# Patient Record
Sex: Male | Born: 1970 | Race: Black or African American | Hispanic: No | Marital: Single | Smoking: Current every day smoker
Health system: Southern US, Community
[De-identification: ages and names within clinical notes are randomized; demographics above are authoritative.]

## PROBLEM LIST (undated history)

## (undated) DIAGNOSIS — R569 Unspecified convulsions: Secondary | ICD-10-CM

## (undated) DIAGNOSIS — F1011 Alcohol abuse, in remission: Secondary | ICD-10-CM

## (undated) DIAGNOSIS — Z87828 Personal history of other (healed) physical injury and trauma: Secondary | ICD-10-CM

## (undated) DIAGNOSIS — F99 Mental disorder, not otherwise specified: Secondary | ICD-10-CM

## (undated) DIAGNOSIS — M7541 Impingement syndrome of right shoulder: Secondary | ICD-10-CM

## (undated) DIAGNOSIS — I499 Cardiac arrhythmia, unspecified: Secondary | ICD-10-CM

## (undated) HISTORY — PX: CYSTOSCOPY W/ URETERAL STENT PLACEMENT: SHX1429

---

## 1996-09-25 DIAGNOSIS — Z87828 Personal history of other (healed) physical injury and trauma: Secondary | ICD-10-CM

## 1996-09-25 HISTORY — DX: Personal history of other (healed) physical injury and trauma: Z87.828

## 1998-09-08 ENCOUNTER — Emergency Department (HOSPITAL_COMMUNITY): Admission: EM | Admit: 1998-09-08 | Discharge: 1998-09-08 | Payer: Self-pay | Admitting: Family Medicine

## 1999-02-09 ENCOUNTER — Emergency Department (HOSPITAL_COMMUNITY): Admission: EM | Admit: 1999-02-09 | Discharge: 1999-02-09 | Payer: Self-pay | Admitting: Emergency Medicine

## 1999-02-10 ENCOUNTER — Ambulatory Visit (HOSPITAL_COMMUNITY): Admission: RE | Admit: 1999-02-10 | Discharge: 1999-02-10 | Payer: Self-pay | Admitting: *Deleted

## 1999-05-27 ENCOUNTER — Emergency Department (HOSPITAL_COMMUNITY): Admission: EM | Admit: 1999-05-27 | Discharge: 1999-05-27 | Payer: Self-pay | Admitting: Emergency Medicine

## 1999-11-13 ENCOUNTER — Emergency Department (HOSPITAL_COMMUNITY): Admission: EM | Admit: 1999-11-13 | Discharge: 1999-11-13 | Payer: Self-pay | Admitting: Emergency Medicine

## 2000-02-17 ENCOUNTER — Ambulatory Visit (HOSPITAL_COMMUNITY): Admission: RE | Admit: 2000-02-17 | Discharge: 2000-02-17 | Payer: Self-pay | Admitting: *Deleted

## 2000-03-04 ENCOUNTER — Emergency Department (HOSPITAL_COMMUNITY): Admission: EM | Admit: 2000-03-04 | Discharge: 2000-03-04 | Payer: Self-pay | Admitting: Emergency Medicine

## 2000-05-26 ENCOUNTER — Encounter: Payer: Self-pay | Admitting: Emergency Medicine

## 2000-05-26 ENCOUNTER — Emergency Department (HOSPITAL_COMMUNITY): Admission: EM | Admit: 2000-05-26 | Discharge: 2000-05-26 | Payer: Self-pay | Admitting: Emergency Medicine

## 2000-06-04 ENCOUNTER — Emergency Department (HOSPITAL_COMMUNITY): Admission: EM | Admit: 2000-06-04 | Discharge: 2000-06-04 | Payer: Self-pay | Admitting: Emergency Medicine

## 2000-06-04 ENCOUNTER — Encounter: Payer: Self-pay | Admitting: Emergency Medicine

## 2002-09-25 ENCOUNTER — Encounter: Payer: Self-pay | Admitting: Emergency Medicine

## 2002-09-25 ENCOUNTER — Emergency Department (HOSPITAL_COMMUNITY): Admission: EM | Admit: 2002-09-25 | Discharge: 2002-09-25 | Payer: Self-pay

## 2003-07-07 ENCOUNTER — Emergency Department (HOSPITAL_COMMUNITY): Admission: AD | Admit: 2003-07-07 | Discharge: 2003-07-08 | Payer: Self-pay | Admitting: Emergency Medicine

## 2003-07-08 ENCOUNTER — Encounter: Payer: Self-pay | Admitting: Emergency Medicine

## 2003-07-12 ENCOUNTER — Emergency Department (HOSPITAL_COMMUNITY): Admission: EM | Admit: 2003-07-12 | Discharge: 2003-07-12 | Payer: Self-pay | Admitting: Emergency Medicine

## 2003-09-26 DIAGNOSIS — I499 Cardiac arrhythmia, unspecified: Secondary | ICD-10-CM

## 2003-09-26 HISTORY — DX: Cardiac arrhythmia, unspecified: I49.9

## 2003-11-04 ENCOUNTER — Inpatient Hospital Stay (HOSPITAL_COMMUNITY): Admission: AC | Admit: 2003-11-04 | Discharge: 2003-11-24 | Payer: Self-pay

## 2004-01-07 ENCOUNTER — Encounter: Admission: RE | Admit: 2004-01-07 | Discharge: 2004-02-23 | Payer: Self-pay | Admitting: Orthopedic Surgery

## 2004-09-24 ENCOUNTER — Ambulatory Visit: Payer: Self-pay | Admitting: Internal Medicine

## 2004-09-24 ENCOUNTER — Inpatient Hospital Stay (HOSPITAL_COMMUNITY): Admission: EM | Admit: 2004-09-24 | Discharge: 2004-09-27 | Payer: Self-pay | Admitting: Emergency Medicine

## 2004-09-25 HISTORY — PX: SHOULDER ARTHROSCOPY: SHX128

## 2004-09-25 HISTORY — PX: URETEROPLASTY: SUR1409

## 2004-10-07 ENCOUNTER — Ambulatory Visit: Payer: Self-pay | Admitting: Internal Medicine

## 2004-11-22 ENCOUNTER — Ambulatory Visit (HOSPITAL_COMMUNITY): Admission: RE | Admit: 2004-11-22 | Discharge: 2004-11-23 | Payer: Self-pay | Admitting: Orthopedic Surgery

## 2005-05-28 ENCOUNTER — Emergency Department (HOSPITAL_COMMUNITY): Admission: EM | Admit: 2005-05-28 | Discharge: 2005-05-28 | Payer: Self-pay | Admitting: Emergency Medicine

## 2005-06-06 ENCOUNTER — Emergency Department (HOSPITAL_COMMUNITY): Admission: EM | Admit: 2005-06-06 | Discharge: 2005-06-06 | Payer: Self-pay | Admitting: Family Medicine

## 2006-01-07 ENCOUNTER — Emergency Department (HOSPITAL_COMMUNITY): Admission: EM | Admit: 2006-01-07 | Discharge: 2006-01-07 | Payer: Self-pay | Admitting: Emergency Medicine

## 2006-02-24 ENCOUNTER — Emergency Department (HOSPITAL_COMMUNITY): Admission: EM | Admit: 2006-02-24 | Discharge: 2006-02-24 | Payer: Self-pay | Admitting: Emergency Medicine

## 2006-02-24 ENCOUNTER — Emergency Department (HOSPITAL_COMMUNITY): Admission: EM | Admit: 2006-02-24 | Discharge: 2006-02-25 | Payer: Self-pay | Admitting: Emergency Medicine

## 2006-03-02 ENCOUNTER — Emergency Department (HOSPITAL_COMMUNITY): Admission: EM | Admit: 2006-03-02 | Discharge: 2006-03-02 | Payer: Self-pay | Admitting: Emergency Medicine

## 2006-03-26 ENCOUNTER — Emergency Department (HOSPITAL_COMMUNITY): Admission: EM | Admit: 2006-03-26 | Discharge: 2006-03-26 | Payer: Self-pay | Admitting: Emergency Medicine

## 2006-03-26 ENCOUNTER — Ambulatory Visit: Payer: Self-pay | Admitting: Family Medicine

## 2006-04-10 ENCOUNTER — Emergency Department (HOSPITAL_COMMUNITY): Admission: EM | Admit: 2006-04-10 | Discharge: 2006-04-10 | Payer: Self-pay | Admitting: Emergency Medicine

## 2006-04-12 ENCOUNTER — Emergency Department (HOSPITAL_COMMUNITY): Admission: EM | Admit: 2006-04-12 | Discharge: 2006-04-13 | Payer: Self-pay | Admitting: Emergency Medicine

## 2006-04-18 ENCOUNTER — Ambulatory Visit: Payer: Self-pay | Admitting: Family Medicine

## 2006-04-22 ENCOUNTER — Emergency Department (HOSPITAL_COMMUNITY): Admission: EM | Admit: 2006-04-22 | Discharge: 2006-04-22 | Payer: Self-pay | Admitting: Emergency Medicine

## 2006-04-23 ENCOUNTER — Emergency Department (HOSPITAL_COMMUNITY): Admission: EM | Admit: 2006-04-23 | Discharge: 2006-04-24 | Payer: Self-pay | Admitting: Emergency Medicine

## 2006-04-23 ENCOUNTER — Ambulatory Visit: Payer: Self-pay | Admitting: Family Medicine

## 2006-05-02 ENCOUNTER — Emergency Department (HOSPITAL_COMMUNITY): Admission: EM | Admit: 2006-05-02 | Discharge: 2006-05-02 | Payer: Self-pay | Admitting: Emergency Medicine

## 2006-05-13 ENCOUNTER — Emergency Department (HOSPITAL_COMMUNITY): Admission: EM | Admit: 2006-05-13 | Discharge: 2006-05-14 | Payer: Self-pay | Admitting: Emergency Medicine

## 2006-05-16 ENCOUNTER — Emergency Department (HOSPITAL_COMMUNITY): Admission: EM | Admit: 2006-05-16 | Discharge: 2006-05-16 | Payer: Self-pay | Admitting: Emergency Medicine

## 2006-05-23 ENCOUNTER — Ambulatory Visit: Payer: Self-pay | Admitting: Family Medicine

## 2006-09-05 ENCOUNTER — Ambulatory Visit (HOSPITAL_BASED_OUTPATIENT_CLINIC_OR_DEPARTMENT_OTHER): Admission: RE | Admit: 2006-09-05 | Discharge: 2006-09-05 | Payer: Self-pay | Admitting: Urology

## 2006-10-21 ENCOUNTER — Emergency Department (HOSPITAL_COMMUNITY): Admission: EM | Admit: 2006-10-21 | Discharge: 2006-10-21 | Payer: Self-pay | Admitting: Emergency Medicine

## 2006-12-09 ENCOUNTER — Emergency Department (HOSPITAL_COMMUNITY): Admission: EM | Admit: 2006-12-09 | Discharge: 2006-12-09 | Payer: Self-pay | Admitting: *Deleted

## 2007-01-05 ENCOUNTER — Emergency Department (HOSPITAL_COMMUNITY): Admission: EM | Admit: 2007-01-05 | Discharge: 2007-01-05 | Payer: Self-pay | Admitting: Emergency Medicine

## 2007-02-07 ENCOUNTER — Ambulatory Visit (HOSPITAL_COMMUNITY): Admission: RE | Admit: 2007-02-07 | Discharge: 2007-02-08 | Payer: Self-pay | Admitting: Urology

## 2007-02-11 ENCOUNTER — Emergency Department (HOSPITAL_COMMUNITY): Admission: EM | Admit: 2007-02-11 | Discharge: 2007-02-11 | Payer: Self-pay | Admitting: Emergency Medicine

## 2007-03-14 DIAGNOSIS — R569 Unspecified convulsions: Secondary | ICD-10-CM | POA: Insufficient documentation

## 2008-02-14 ENCOUNTER — Emergency Department (HOSPITAL_COMMUNITY): Admission: EM | Admit: 2008-02-14 | Discharge: 2008-02-14 | Payer: Self-pay | Admitting: Emergency Medicine

## 2008-12-25 ENCOUNTER — Emergency Department (HOSPITAL_COMMUNITY): Admission: EM | Admit: 2008-12-25 | Discharge: 2008-12-25 | Payer: Self-pay | Admitting: Emergency Medicine

## 2009-01-27 ENCOUNTER — Ambulatory Visit: Payer: Self-pay | Admitting: Internal Medicine

## 2009-01-27 ENCOUNTER — Encounter (INDEPENDENT_AMBULATORY_CARE_PROVIDER_SITE_OTHER): Payer: Self-pay | Admitting: Adult Health

## 2009-01-27 LAB — CONVERTED CEMR LAB
Albumin: 4.4 g/dL (ref 3.5–5.2)
BUN: 11 mg/dL (ref 6–23)
Basophils Absolute: 0 10*3/uL (ref 0.0–0.1)
Benzodiazepines.: NEGATIVE
Calcium: 9.7 mg/dL (ref 8.4–10.5)
Chloride: 102 meq/L (ref 96–112)
Creatinine, Ser: 1.21 mg/dL (ref 0.40–1.50)
Creatinine,U: 237.1 mg/dL
Eosinophils Absolute: 0.1 10*3/uL (ref 0.0–0.7)
Eosinophils Relative: 2 % (ref 0–5)
Glucose, Bld: 89 mg/dL (ref 70–99)
HCT: 47.9 % (ref 39.0–52.0)
Hemoglobin: 15.4 g/dL (ref 13.0–17.0)
Lymphocytes Relative: 37 % (ref 12–46)
Lymphs Abs: 1.8 10*3/uL (ref 0.7–4.0)
MCV: 94.7 fL (ref 78.0–100.0)
Methadone: NEGATIVE
Monocytes Absolute: 0.3 10*3/uL (ref 0.1–1.0)
Potassium: 4.1 meq/L (ref 3.5–5.3)
Propoxyphene: NEGATIVE
RDW: 13.8 % (ref 11.5–15.5)

## 2009-03-26 ENCOUNTER — Encounter (INDEPENDENT_AMBULATORY_CARE_PROVIDER_SITE_OTHER): Payer: Self-pay | Admitting: Adult Health

## 2009-03-26 ENCOUNTER — Ambulatory Visit: Payer: Self-pay | Admitting: Internal Medicine

## 2009-03-26 LAB — CONVERTED CEMR LAB: Valproic Acid Lvl: <1 ug/mL — ABNORMAL LOW (ref 50.0–100.0)

## 2009-07-13 ENCOUNTER — Encounter (INDEPENDENT_AMBULATORY_CARE_PROVIDER_SITE_OTHER): Payer: Self-pay | Admitting: Adult Health

## 2009-07-13 ENCOUNTER — Ambulatory Visit: Payer: Self-pay | Admitting: Internal Medicine

## 2009-07-13 LAB — CONVERTED CEMR LAB
ALT: 10 units/L (ref 0–53)
AST: 14 units/L (ref 0–37)
Alkaline Phosphatase: 64 units/L (ref 39–117)
LDL Cholesterol: 79 mg/dL (ref 0–99)
Sodium: 142 meq/L (ref 135–145)
Total Bilirubin: 0.5 mg/dL (ref 0.3–1.2)
Total Protein: 7.1 g/dL (ref 6.0–8.3)
Triglycerides: 60 mg/dL (ref <150)
VLDL: 12 mg/dL (ref 0–40)

## 2010-02-19 ENCOUNTER — Emergency Department (HOSPITAL_COMMUNITY): Admission: EM | Admit: 2010-02-19 | Discharge: 2010-02-19 | Payer: Self-pay | Admitting: Emergency Medicine

## 2010-03-03 ENCOUNTER — Emergency Department (HOSPITAL_COMMUNITY): Admission: EM | Admit: 2010-03-03 | Discharge: 2010-03-03 | Payer: Self-pay | Admitting: Emergency Medicine

## 2010-09-30 ENCOUNTER — Emergency Department (HOSPITAL_COMMUNITY)
Admission: EM | Admit: 2010-09-30 | Discharge: 2010-10-01 | Payer: Self-pay | Source: Home / Self Care | Admitting: Emergency Medicine

## 2010-10-10 LAB — POCT I-STAT, CHEM 8
BUN: 13 mg/dL (ref 6–23)
Calcium, Ion: 1.12 mmol/L (ref 1.12–1.32)
Chloride: 103 mEq/L (ref 96–112)
Creatinine, Ser: 1.3 mg/dL (ref 0.4–1.5)
Glucose, Bld: 98 mg/dL (ref 70–99)
HCT: 48 % (ref 39.0–52.0)
Hemoglobin: 16.3 g/dL (ref 13.0–17.0)
Potassium: 4 mEq/L (ref 3.5–5.1)
Sodium: 140 mEq/L (ref 135–145)
TCO2: 31 mmol/L (ref 0–100)

## 2010-10-10 LAB — VALPROIC ACID LEVEL: Valproic Acid Lvl: <10 ug/mL — ABNORMAL LOW (ref 50.0–100.0)

## 2010-10-10 LAB — CBC
HCT: 44.9 % (ref 39.0–52.0)
Hemoglobin: 14.9 g/dL (ref 13.0–17.0)
MCH: 30.9 pg (ref 26.0–34.0)
MCHC: 33.2 g/dL (ref 30.0–36.0)
MCV: 93.2 fL (ref 78.0–100.0)
Platelets: 271 10*3/uL (ref 150–400)
RBC: 4.82 MIL/uL (ref 4.22–5.81)
RDW: 13 % (ref 11.5–15.5)
WBC: 8 10*3/uL (ref 4.0–10.5)

## 2010-10-10 LAB — DIFFERENTIAL
Basophils Absolute: 0 10*3/uL (ref 0.0–0.1)
Basophils Relative: 0 % (ref 0–1)
Eosinophils Absolute: 0.3 10*3/uL (ref 0.0–0.7)
Eosinophils Relative: 3 % (ref 0–5)
Lymphocytes Relative: 38 % (ref 12–46)
Lymphs Abs: 3.1 10*3/uL (ref 0.7–4.0)
Monocytes Absolute: 0.5 10*3/uL (ref 0.1–1.0)
Monocytes Relative: 7 % (ref 3–12)
Neutro Abs: 4.2 10*3/uL (ref 1.7–7.7)
Neutrophils Relative %: 52 % (ref 43–77)

## 2010-11-24 ENCOUNTER — Other Ambulatory Visit (HOSPITAL_COMMUNITY): Payer: Self-pay | Admitting: Family Medicine

## 2010-11-24 DIAGNOSIS — IMO0001 Reserved for inherently not codable concepts without codable children: Secondary | ICD-10-CM

## 2010-12-01 ENCOUNTER — Ambulatory Visit (HOSPITAL_COMMUNITY)
Admission: RE | Admit: 2010-12-01 | Discharge: 2010-12-01 | Disposition: A | Discharge status: Home / Self Care | Payer: Medicaid Other | Source: Ambulatory Visit | Attending: Family Medicine | Admitting: Family Medicine

## 2010-12-01 DIAGNOSIS — Z09 Encounter for follow-up examination after completed treatment for conditions other than malignant neoplasm: Secondary | ICD-10-CM | POA: Insufficient documentation

## 2010-12-01 DIAGNOSIS — M19019 Primary osteoarthritis, unspecified shoulder: Secondary | ICD-10-CM | POA: Insufficient documentation

## 2010-12-01 DIAGNOSIS — IMO0001 Reserved for inherently not codable concepts without codable children: Secondary | ICD-10-CM

## 2010-12-12 LAB — ETHANOL: Alcohol, Ethyl (B): 129 mg/dL — ABNORMAL HIGH (ref 0–10)

## 2010-12-20 ENCOUNTER — Emergency Department (HOSPITAL_COMMUNITY)
Admission: EM | Admit: 2010-12-20 | Discharge: 2010-12-20 | Disposition: A | Discharge status: Home / Self Care | Payer: Medicaid Other | Attending: Emergency Medicine | Admitting: Emergency Medicine

## 2010-12-20 DIAGNOSIS — L2989 Other pruritus: Secondary | ICD-10-CM | POA: Insufficient documentation

## 2010-12-20 DIAGNOSIS — L509 Urticaria, unspecified: Secondary | ICD-10-CM | POA: Insufficient documentation

## 2010-12-20 DIAGNOSIS — Z79899 Other long term (current) drug therapy: Secondary | ICD-10-CM | POA: Insufficient documentation

## 2010-12-20 DIAGNOSIS — L270 Generalized skin eruption due to drugs and medicaments taken internally: Secondary | ICD-10-CM | POA: Insufficient documentation

## 2010-12-20 DIAGNOSIS — L851 Acquired keratosis [keratoderma] palmaris et plantaris: Secondary | ICD-10-CM | POA: Insufficient documentation

## 2010-12-20 DIAGNOSIS — L298 Other pruritus: Secondary | ICD-10-CM | POA: Insufficient documentation

## 2010-12-20 DIAGNOSIS — F411 Generalized anxiety disorder: Secondary | ICD-10-CM | POA: Insufficient documentation

## 2010-12-20 DIAGNOSIS — T50995A Adverse effect of other drugs, medicaments and biological substances, initial encounter: Secondary | ICD-10-CM | POA: Insufficient documentation

## 2010-12-20 DIAGNOSIS — G40909 Epilepsy, unspecified, not intractable, without status epilepticus: Secondary | ICD-10-CM | POA: Insufficient documentation

## 2010-12-21 ENCOUNTER — Ambulatory Visit: Payer: Medicaid Other | Attending: Family Medicine | Admitting: Physical Therapy

## 2010-12-21 DIAGNOSIS — IMO0001 Reserved for inherently not codable concepts without codable children: Secondary | ICD-10-CM | POA: Insufficient documentation

## 2010-12-21 DIAGNOSIS — M25519 Pain in unspecified shoulder: Secondary | ICD-10-CM | POA: Insufficient documentation

## 2010-12-21 DIAGNOSIS — M25619 Stiffness of unspecified shoulder, not elsewhere classified: Secondary | ICD-10-CM | POA: Insufficient documentation

## 2011-01-02 ENCOUNTER — Ambulatory Visit: Payer: Medicaid Other | Attending: Family Medicine

## 2011-01-02 DIAGNOSIS — M25619 Stiffness of unspecified shoulder, not elsewhere classified: Secondary | ICD-10-CM | POA: Insufficient documentation

## 2011-01-02 DIAGNOSIS — IMO0001 Reserved for inherently not codable concepts without codable children: Secondary | ICD-10-CM | POA: Insufficient documentation

## 2011-01-02 DIAGNOSIS — M25519 Pain in unspecified shoulder: Secondary | ICD-10-CM | POA: Insufficient documentation

## 2011-01-04 ENCOUNTER — Inpatient Hospital Stay (INDEPENDENT_AMBULATORY_CARE_PROVIDER_SITE_OTHER)
Admission: RE | Admit: 2011-01-04 | Discharge: 2011-01-04 | Disposition: A | Discharge status: Home / Self Care | Payer: Medicaid Other | Source: Ambulatory Visit | Attending: Family Medicine | Admitting: Family Medicine

## 2011-01-04 DIAGNOSIS — B029 Zoster without complications: Secondary | ICD-10-CM

## 2011-01-04 LAB — POCT I-STAT, CHEM 8
Calcium, Ion: 1.08 mmol/L — ABNORMAL LOW (ref 1.12–1.32)
Chloride: 105 mEq/L (ref 96–112)
Creatinine, Ser: 1.6 mg/dL — ABNORMAL HIGH (ref 0.4–1.5)
Glucose, Bld: 95 mg/dL (ref 70–99)
HCT: 45 % (ref 39.0–52.0)
Hemoglobin: 15.3 g/dL (ref 13.0–17.0)

## 2011-01-04 LAB — ETHANOL: Alcohol, Ethyl (B): <5 mg/dL (ref 0–10)

## 2011-01-09 ENCOUNTER — Ambulatory Visit: Payer: Medicaid Other

## 2011-01-23 ENCOUNTER — Ambulatory Visit: Payer: Medicaid Other

## 2011-01-25 ENCOUNTER — Ambulatory Visit: Payer: Medicaid Other | Attending: Family Medicine | Admitting: Rehabilitative and Restorative Service Providers"

## 2011-01-25 DIAGNOSIS — M25619 Stiffness of unspecified shoulder, not elsewhere classified: Secondary | ICD-10-CM | POA: Insufficient documentation

## 2011-01-25 DIAGNOSIS — IMO0001 Reserved for inherently not codable concepts without codable children: Secondary | ICD-10-CM | POA: Insufficient documentation

## 2011-01-25 DIAGNOSIS — M25519 Pain in unspecified shoulder: Secondary | ICD-10-CM | POA: Insufficient documentation

## 2011-02-07 NOTE — Op Note (Signed)
NAMEBAYARD, MORE                ACCOUNT NO.:  [id number]   MEDICAL RECORD NO.:  [id number]          PATIENT TYPE:  AMB   LOCATION:  DAY                          FACILITY:  [facility]   PHYSICIAN:  Martina Sinner, MD DATE OF BIRTH:  [birth year]   DATE OF PROCEDURE:  02/07/2007  DATE OF DISCHARGE:                               OPERATIVE REPORT   PREOPERATIVE DIAGNOSIS:  Urethral stricture.   POSTOPERATIVE DIAGNOSIS:  Urethral stricture.   SURGERY:  One-stage urethroplasty plus full-thickness penile skin graft  plus cystoscopy.   SURGEON:  Martina Sinner, MD.   ASSISTANT:  Dr. Terie Purser.   ASSISTANT:  Mr. Kuba Shepherd has a symptomatic urethral stricture which  has recurred in spite of a number of internal urethrotomies.   The patient was prepped and draped in usual fashion.  Extra care was  taken to minimize the risk compartment syndrome neuropathy and DVT and  leg positioning.  I initially cystoscoped the patient and marked the  proximal aspect of the stricture.  I could feel the scope high in the  perineum just under the scrotum.   Because of the proximal location of the stricture, I bivalved the  scrotum approximately 1 inch and I sent the incision approximately 8 or  9 cm into his long perineum.  I dissected down to subcutaneous tissue.  I could easily identify the bulbospongiosus muscle.  The bulbospongiosus  muscle was split and taken back nearing the perineal tendon.  Once I  identified the corporal bodies bilaterally, you could see an hour glass  effect near the distal bulbar urethra.   Cystoscopically, I placed a sensor wire followed by a 6-French ureteral  catheter into the bladder.  With a red rubber catheter marking the  urethra, I incised onto its tip and then opened up the short hour glass  stricture.  I then cystoscoped the patient proximally and there was no  question he had a lot of gray urethra was spongiofibrosis and cicatrix  almost all the way back  to the membranous urethra.  I then bivalved the  bulbar urethra nearly back to the membranous urethra.  I cystoscoped the  patient to make certain I had gone back far enough and spared the  external sphincter.   I used multiple 4-0 silk pop offs and a Scott retractor to open up the  urethra.  I measured 7 cm.   I then procuring a 7 x 2 cm elliptical full-thickness skin graft.  Multiple 3-0 chromics were used to close the skin after good hemostasis  of the penis.  I defatted the penile skin graft using my usual  technique.   Using 4-0 Vicryl, two at each apex, I sewed the graft with running 4-0  Vicryl over am 18-French Cuda catheter.  I was very happy with the  watertight closure.  I made some incisions to fenestrate the graft.  I  then closed the nice, healthy sponge with four interrupted 3-0 Vicryls,  picking up the graft and sewing the graft to its base.  I then closed  the bulbospongiosus muscle with  3-0 Vicryl for good hemostasis.   I then did four layer closure with running through 3-0 Vicryl to  reapproximate the scrotal and perineal anatomy.  A short Jackson-Pratt  drain was placed beneath the bulbospongiosus muscle.  Leg position was  good at the end of the case.  Skin was closed with subcuticular suture.  An 18-French catheter was draining well at the end of the case.  Telfa,  Steri-Strips, fluffs and mesh pants were use for the perineal incision.  Steri-Strips, Telfa and a very, very lightly wrapped Coban was used for  the penile skin.   The patient did very well.  He was taken to the recovery room.  He will  have a pericatheter urethrogram two weeks from Monday which is  approximately seven days postoperatively.  I will arrange this.  I will  keep him on antibiotics in the interim.  Hopefully this operation will  reach his treatment goal and prove curative.           ______________________________  Martina Sinner, MD  Electronically Signed     SAM/MEDQ  D:   02/07/2007  T:  02/07/2007  Job:  [id number]
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
## 2011-02-10 NOTE — Op Note (Signed)
NAMEZYMIR, NAPOLI                ACCOUNT NO.:  [id number]   MEDICAL RECORD NO.:  [id number]          PATIENT TYPE:  OIB   LOCATION:  2550                         FACILITY:  [facility]   PHYSICIAN:  Burnard Bunting, M.D.    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   PROCEDURES:  Right shoulder diagnostic operative arthroscopy with  manipulation under anesthesia, subacromial decompression, extensive  debridement of intra-articular synovitis and open biceps tenodesis.   SURGEON:  Burnard Bunting, M.D.   ANESTHESIA:  General endotracheal.   ESTIMATED BLOOD LOSS:  25 mL.   DRAINS:  None.   PROCEDURE IN DETAIL:  The patient was brought to the operating room where a  general endotracheal anesthesia was induced.  Preoperative IV antibiotics  were administered.  The right shoulder was manipulated into full forward  flexion, abduction and external rotation at 15 degrees of abduction.  Following the manipulation, the patient was placed in the lateral position  with the left axilla and left perineal nerve well padded.  The arm was then  suspended with 10 pounds of traction and 15 degrees of forward flexion and  50 degrees of abduction.  The arm was prepped with Duraprep solution and  draped in a sterile manner.  The subacromial of the shoulder was identified,  including the posterior, lateral and anterior margins of the acromion, as  well as coracoid process.  Subacromial injection of saline and epinephrine  was then performed.  Saline 20 mL only was then injected into the  glenohumeral joint.  The glenohumeral joint was then entered with through a  posterolateral portal created  2 cm inferior and medial to the posterolateral  margin of the acromion.  The diagnostic arthroscopy was performed.  A type 2  slap tear was present.  Extensive synovitis within the glenohumeral joint  and rotator interval was present.  The rotator cuff was intact.  The  glenohumeral articular surfaces were intact.  Anterior inferior and  posterior inferior glenohumeral ligaments were also attached.  At this time,  extensive debridement was performed with the shaver and ArthroCare wand.  The rotator interval was released.  The biceps tendon was truncated and the  unstable superior labral tissue was debrided.  Following extensive  debridement, the scope was placed into the subacromial space.  A lateral  portal was created.  Subacromial decompression was performed with bursectomy  and CA ligament release and not resection.  At this time, the shoulder joint  was thoroughly irrigated.  The instruments were removed from the portals,  which were then closed using 3-0 nylon suture.  Collier Flowers was used to cover the  operative field.  An anterior incision was made off of the anterolateral  margin of the acromion.  The skin and subcutaneous tissues were sharply  divided.  A stay suture was placed 3 cm from the anterolateral margin of the  acromion at the apex of the deltoid split.  The bicipital groove was then  palpated.  It was incised.  The biceps tendon was delivered and tenodesed  using a 7 x 23 mm bioabsorbable interference screw.  Excellent fixation was  achieved.  The incision was thoroughly irrigated.  The transverse humeral  ligament was closed using 0 Vicryl suture.  The deltoid split was closed  using 0 Vicryl suture in simple fashion.  The skin was closed using  interrupted 2-0 Vicryl suture followed by running 3-0 Prolene.  The patient  was placed in a bulky dressing and shoulder sling.  He tolerated the  procedure without any complications.      GSD/MEDQ  D:  11/22/2004  T:  11/22/2004   Job:  [id number]
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
## 2012-07-30 ENCOUNTER — Encounter (HOSPITAL_BASED_OUTPATIENT_CLINIC_OR_DEPARTMENT_OTHER): Payer: Self-pay | Admitting: *Deleted

## 2012-07-30 NOTE — Progress Notes (Signed)
Call not completed-dr fitzgerald says pt needs to get back on his seizure preventive meds-and will need an EKG and BMET prior to surgery-pt says he cannot afford his meds, but can get cigarettes. Pt notified-and dr Dion Saucier office notified he has to rs until this is done.

## 2012-08-02 ENCOUNTER — Ambulatory Visit (HOSPITAL_BASED_OUTPATIENT_CLINIC_OR_DEPARTMENT_OTHER): Admit: 2012-08-02 | Payer: Self-pay | Admitting: Orthopedic Surgery

## 2012-08-02 ENCOUNTER — Encounter (HOSPITAL_BASED_OUTPATIENT_CLINIC_OR_DEPARTMENT_OTHER): Payer: Self-pay

## 2012-08-02 HISTORY — DX: Mental disorder, not otherwise specified: F99

## 2012-08-02 HISTORY — DX: Alcohol abuse, in remission: F10.11

## 2012-08-02 HISTORY — DX: Unspecified convulsions: R56.9

## 2012-08-02 HISTORY — DX: Personal history of other (healed) physical injury and trauma: Z87.828

## 2012-08-02 HISTORY — DX: Cardiac arrhythmia, unspecified: I49.9

## 2012-08-02 SURGERY — ARTHROSCOPY, SHOULDER
Anesthesia: General | Site: Knee | Laterality: Right

## 2012-11-30 DIAGNOSIS — G8929 Other chronic pain: Secondary | ICD-10-CM | POA: Insufficient documentation

## 2012-11-30 DIAGNOSIS — Z87820 Personal history of traumatic brain injury: Secondary | ICD-10-CM | POA: Insufficient documentation

## 2012-11-30 DIAGNOSIS — M25519 Pain in unspecified shoulder: Secondary | ICD-10-CM | POA: Insufficient documentation

## 2012-11-30 DIAGNOSIS — Y939 Activity, unspecified: Secondary | ICD-10-CM | POA: Insufficient documentation

## 2012-11-30 DIAGNOSIS — X58XXXA Exposure to other specified factors, initial encounter: Secondary | ICD-10-CM | POA: Insufficient documentation

## 2012-11-30 DIAGNOSIS — G40909 Epilepsy, unspecified, not intractable, without status epilepticus: Secondary | ICD-10-CM | POA: Insufficient documentation

## 2012-11-30 DIAGNOSIS — F17200 Nicotine dependence, unspecified, uncomplicated: Secondary | ICD-10-CM | POA: Insufficient documentation

## 2012-11-30 DIAGNOSIS — Z8679 Personal history of other diseases of the circulatory system: Secondary | ICD-10-CM | POA: Insufficient documentation

## 2012-11-30 DIAGNOSIS — F489 Nonpsychotic mental disorder, unspecified: Secondary | ICD-10-CM | POA: Insufficient documentation

## 2012-11-30 DIAGNOSIS — Y929 Unspecified place or not applicable: Secondary | ICD-10-CM | POA: Insufficient documentation

## 2012-11-30 DIAGNOSIS — F1011 Alcohol abuse, in remission: Secondary | ICD-10-CM | POA: Insufficient documentation

## 2012-11-30 DIAGNOSIS — IMO0001 Reserved for inherently not codable concepts without codable children: Secondary | ICD-10-CM | POA: Insufficient documentation

## 2012-12-01 ENCOUNTER — Emergency Department (HOSPITAL_COMMUNITY): Payer: Medicaid Other

## 2012-12-01 ENCOUNTER — Emergency Department (HOSPITAL_COMMUNITY)
Admission: EM | Admit: 2012-12-01 | Discharge: 2012-12-01 | Disposition: A | Discharge status: Home / Self Care | Payer: Medicaid Other | Attending: Emergency Medicine | Admitting: Emergency Medicine

## 2012-12-01 ENCOUNTER — Encounter (HOSPITAL_COMMUNITY): Payer: Self-pay | Admitting: Emergency Medicine

## 2012-12-01 MED ORDER — PREDNISONE 20 MG PO TABS: ORAL_TABLET | ORAL | Status: DC

## 2012-12-01 MED ORDER — IBUPROFEN 600 MG PO TABS: 600.0000 mg | ORAL_TABLET | Freq: Four times a day (QID) | ORAL | Status: DC | PRN

## 2012-12-01 MED ORDER — OXYCODONE-ACETAMINOPHEN 5-325 MG PO TABS: 1.0000 | ORAL_TABLET | Freq: Four times a day (QID) | ORAL | Status: DC | PRN

## 2012-12-01 MED ORDER — OXYCODONE-ACETAMINOPHEN 5-325 MG PO TABS
1.0000 | ORAL_TABLET | Freq: Once | ORAL | Status: AC
  Administered 2012-12-01: 1 via ORAL
  Filled 2012-12-01: qty 1

## 2012-12-01 NOTE — ED Notes (Signed)
Patient transported to X-ray 
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
## 2012-12-04 NOTE — ED Provider Notes (Signed)
Medical screening examination/treatment/procedure(s) were performed by non-physician practitioner and as supervising physician I was immediately available for consultation/collaboration.   John L Molpus, MD 12/04/12 2247 

## 2013-06-25 ENCOUNTER — Ambulatory Visit (INDEPENDENT_AMBULATORY_CARE_PROVIDER_SITE_OTHER): Payer: Medicaid Other | Admitting: Diagnostic Neuroimaging

## 2013-06-25 ENCOUNTER — Encounter: Payer: Self-pay | Admitting: Diagnostic Neuroimaging

## 2013-06-25 VITALS — BP 116/74 | HR 65 | Temp 98.0°F | Ht 76.0 in | Wt 179.0 lb

## 2013-06-25 DIAGNOSIS — G40909 Epilepsy, unspecified, not intractable, without status epilepticus: Secondary | ICD-10-CM
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
## 2013-06-25 NOTE — Patient Instructions (Signed)
I will check EEG.  Slowly reduce beer intake. Ask medical doctor for advice.  Take daily multi-vitamin.

## 2013-07-03 ENCOUNTER — Ambulatory Visit (INDEPENDENT_AMBULATORY_CARE_PROVIDER_SITE_OTHER): Payer: Medicaid Other | Admitting: Radiology

## 2013-07-03 DIAGNOSIS — G40909 Epilepsy, unspecified, not intractable, without status epilepticus: Secondary | ICD-10-CM

## 2013-07-22 NOTE — Procedures (Signed)
   [facility]  EEG (ELECTROENCEPHALOGRAM) REPORT   STUDY DATE: 07/03/13  PATIENT NAME: John Howe DOB: 1971/08/24 MRN: [id number]  ORDERING CLINICIAN: Joycelyn Schmid, MD   TECHNOLOGIST: Kaylyn Lim TECHNIQUE: Electroencephalogram was recorded utilizing standard 10-20 system of lead placement and reformatted into average and bipolar montages.  RECORDING TIME: 32 minutes ACTIVATION: photic stimulation  CLINICAL INFORMATION: 42 year old male with post-traumatic seizures.  FINDINGS: Poor relaxation and muscle artifact. Background rhythms of 10-11 hertz and 30-40 microvolts. No focal, lateralizing, epileptiform activity or seizures are seen. Patient recorded in the awake state.   IMPRESSION:  Normal EEG in the awake state.   INTERPRETING PHYSICIAN:  Suanne Marker, MD Certified in Neurology, Neurophysiology and Neuroimaging  [facility] [address] [place], [place] [address] [phone]

## 2013-09-30 ENCOUNTER — Encounter (HOSPITAL_BASED_OUTPATIENT_CLINIC_OR_DEPARTMENT_OTHER): Payer: Self-pay | Admitting: *Deleted

## 2013-09-30 NOTE — Progress Notes (Signed)
Pt saw neuro-eeg done did not have to go back on seizure meds States transportation bringing him-told him someone will have to come sign him out and take him home

## 2013-10-03 ENCOUNTER — Ambulatory Visit (HOSPITAL_BASED_OUTPATIENT_CLINIC_OR_DEPARTMENT_OTHER)
Admission: RE | Admit: 2013-10-03 | Discharge: 2013-10-03 | Disposition: A | Discharge status: Home / Self Care | Payer: Medicaid Other | Source: Ambulatory Visit | Attending: Orthopedic Surgery | Admitting: Orthopedic Surgery

## 2013-10-03 ENCOUNTER — Encounter (HOSPITAL_BASED_OUTPATIENT_CLINIC_OR_DEPARTMENT_OTHER): Payer: Medicaid Other | Admitting: Anesthesiology

## 2013-10-03 ENCOUNTER — Encounter (HOSPITAL_BASED_OUTPATIENT_CLINIC_OR_DEPARTMENT_OTHER): Payer: Self-pay | Admitting: *Deleted

## 2013-10-03 ENCOUNTER — Encounter (HOSPITAL_BASED_OUTPATIENT_CLINIC_OR_DEPARTMENT_OTHER)
Admission: RE | Disposition: A | Discharge status: Home / Self Care | Payer: Self-pay | Source: Ambulatory Visit | Attending: Orthopedic Surgery

## 2013-10-03 ENCOUNTER — Ambulatory Visit (HOSPITAL_BASED_OUTPATIENT_CLINIC_OR_DEPARTMENT_OTHER): Payer: Medicaid Other | Admitting: Anesthesiology

## 2013-10-03 DIAGNOSIS — G40909 Epilepsy, unspecified, not intractable, without status epilepticus: Secondary | ICD-10-CM | POA: Insufficient documentation

## 2013-10-03 DIAGNOSIS — M25819 Other specified joint disorders, unspecified shoulder: Secondary | ICD-10-CM | POA: Insufficient documentation

## 2013-10-03 DIAGNOSIS — M19019 Primary osteoarthritis, unspecified shoulder: Secondary | ICD-10-CM | POA: Insufficient documentation

## 2013-10-03 DIAGNOSIS — F17200 Nicotine dependence, unspecified, uncomplicated: Secondary | ICD-10-CM | POA: Insufficient documentation

## 2013-10-03 DIAGNOSIS — M7541 Impingement syndrome of right shoulder: Secondary | ICD-10-CM

## 2013-10-03 DIAGNOSIS — Z880 Allergy status to penicillin: Secondary | ICD-10-CM | POA: Insufficient documentation

## 2013-10-03 DIAGNOSIS — M7580 Other shoulder lesions, unspecified shoulder: Principal | ICD-10-CM

## 2013-10-03 DIAGNOSIS — G8929 Other chronic pain: Secondary | ICD-10-CM | POA: Insufficient documentation

## 2013-10-03 HISTORY — DX: Impingement syndrome of right shoulder: M75.41

## 2013-10-03 HISTORY — PX: SHOULDER ARTHROSCOPY: SHX128

## 2013-10-03 LAB — POCT HEMOGLOBIN-HEMACUE: Hemoglobin: 15.4 g/dL (ref 13.0–17.0)

## 2013-10-03 SURGERY — ARTHROSCOPY, SHOULDER
Anesthesia: General | Site: Shoulder | Laterality: Right

## 2013-10-03 MED ORDER — VANCOMYCIN HCL IN DEXTROSE 1-5 GM/200ML-% IV SOLN
INTRAVENOUS | Status: AC
  Filled 2013-10-03: qty 200

## 2013-10-03 MED ORDER — MIDAZOLAM HCL 2 MG/2ML IJ SOLN
INTRAMUSCULAR | Status: AC
  Filled 2013-10-03: qty 2

## 2013-10-03 MED ORDER — LACTATED RINGERS IV SOLN
INTRAVENOUS | Status: DC
  Administered 2013-10-03 (×3): via INTRAVENOUS

## 2013-10-03 MED ORDER — VANCOMYCIN HCL 1000 MG IV SOLR
INTRAVENOUS | Status: AC
  Filled 2013-10-03: qty 1000

## 2013-10-03 MED ORDER — LIDOCAINE HCL (CARDIAC) 10 MG/ML IV SOLN
INTRAVENOUS | Status: DC | PRN
  Administered 2013-10-03: 80 mg via INTRAVENOUS

## 2013-10-03 MED ORDER — PROPOFOL 10 MG/ML IV BOLUS
INTRAVENOUS | Status: DC | PRN
  Administered 2013-10-03: 150 mg via INTRAVENOUS

## 2013-10-03 MED ORDER — MIDAZOLAM HCL 2 MG/2ML IJ SOLN
1.0000 mg | INTRAMUSCULAR | Status: DC | PRN
  Administered 2013-10-03: 2 mg via INTRAVENOUS

## 2013-10-03 MED ORDER — SODIUM CHLORIDE 0.9 % IR SOLN
Status: DC | PRN
  Administered 2013-10-03: 6000 mL

## 2013-10-03 MED ORDER — HYDROMORPHONE HCL PF 1 MG/ML IJ SOLN: 0.2500 mg | INTRAMUSCULAR | Status: DC | PRN

## 2013-10-03 MED ORDER — ONDANSETRON HCL 4 MG/2ML IJ SOLN: 4.0000 mg | Freq: Once | INTRAMUSCULAR | Status: DC | PRN

## 2013-10-03 MED ORDER — OXYCODONE HCL 5 MG PO TABS: 5.0000 mg | ORAL_TABLET | Freq: Once | ORAL | Status: DC | PRN

## 2013-10-03 MED ORDER — DEXAMETHASONE SODIUM PHOSPHATE 4 MG/ML IJ SOLN
INTRAMUSCULAR | Status: DC | PRN
  Administered 2013-10-03: 10 mg via INTRAVENOUS

## 2013-10-03 MED ORDER — METHOCARBAMOL 500 MG PO TABS: 500.0000 mg | ORAL_TABLET | Freq: Four times a day (QID) | ORAL | Status: DC

## 2013-10-03 MED ORDER — FENTANYL CITRATE 0.05 MG/ML IJ SOLN
50.0000 ug | INTRAMUSCULAR | Status: DC | PRN
  Administered 2013-10-03 (×2): 50 ug via INTRAVENOUS

## 2013-10-03 MED ORDER — ONDANSETRON HCL 4 MG/2ML IJ SOLN
INTRAMUSCULAR | Status: DC | PRN
  Administered 2013-10-03: 4 mg via INTRAVENOUS

## 2013-10-03 MED ORDER — FENTANYL CITRATE 0.05 MG/ML IJ SOLN
INTRAMUSCULAR | Status: DC | PRN
  Administered 2013-10-03: 100 ug via INTRAVENOUS

## 2013-10-03 MED ORDER — FENTANYL CITRATE 0.05 MG/ML IJ SOLN
INTRAMUSCULAR | Status: AC
  Filled 2013-10-03: qty 2

## 2013-10-03 MED ORDER — BUPIVACAINE-EPINEPHRINE PF 0.5-1:200000 % IJ SOLN
INTRAMUSCULAR | Status: DC | PRN
  Administered 2013-10-03: 30 mL via PERINEURAL

## 2013-10-03 MED ORDER — FENTANYL CITRATE 0.05 MG/ML IJ SOLN
INTRAMUSCULAR | Status: AC
  Filled 2013-10-03: qty 4

## 2013-10-03 MED ORDER — PROPOFOL 10 MG/ML IV BOLUS
INTRAVENOUS | Status: AC
  Filled 2013-10-03: qty 20

## 2013-10-03 MED ORDER — OXYCODONE-ACETAMINOPHEN 10-325 MG PO TABS: 1.0000 | ORAL_TABLET | Freq: Four times a day (QID) | ORAL | Status: DC | PRN

## 2013-10-03 MED ORDER — SENNA-DOCUSATE SODIUM 8.6-50 MG PO TABS: 2.0000 | ORAL_TABLET | Freq: Every day | ORAL | Status: DC

## 2013-10-03 MED ORDER — PROMETHAZINE HCL 25 MG PO TABS: 25.0000 mg | ORAL_TABLET | Freq: Four times a day (QID) | ORAL | Status: DC | PRN

## 2013-10-03 MED ORDER — VANCOMYCIN HCL IN DEXTROSE 1-5 GM/200ML-% IV SOLN
1000.0000 mg | INTRAVENOUS | Status: AC
  Administered 2013-10-03: 1000 mg via INTRAVENOUS

## 2013-10-03 MED ORDER — OXYCODONE HCL 5 MG/5ML PO SOLN: 5.0000 mg | Freq: Once | ORAL | Status: DC | PRN

## 2013-10-03 MED ORDER — SUCCINYLCHOLINE CHLORIDE 20 MG/ML IJ SOLN
INTRAMUSCULAR | Status: DC | PRN
  Administered 2013-10-03: 100 mg via INTRAVENOUS

## 2013-10-03 MED ORDER — MEPERIDINE HCL 25 MG/ML IJ SOLN: 6.2500 mg | INTRAMUSCULAR | Status: DC | PRN

## 2013-10-03 SURGICAL SUPPLY — 66 items
BENZOIN TINCTURE PRP APPL 2/3 (GAUZE/BANDAGES/DRESSINGS) ×3 IMPLANT
BLADE CUTTER GATOR 3.5 (BLADE) ×3 IMPLANT
BLADE GREAT WHITE 4.2 (BLADE) IMPLANT
BLADE GREAT WHITE 4.2MM (BLADE)
BLADE SURG 15 STRL LF DISP TIS (BLADE) IMPLANT
BLADE SURG 15 STRL SS (BLADE)
BUR OVAL 4.0 (BURR) IMPLANT
BUR OVAL 6.0 (BURR) ×3 IMPLANT
CANISTER SUCT LVC 12 LTR MEDI- (MISCELLANEOUS) ×3 IMPLANT
CANNULA 5.75X71 LONG (CANNULA) ×3 IMPLANT
CANNULA TWIST IN 8.25X7CM (CANNULA) IMPLANT
CLOSURE WOUND 1/2 X4 (GAUZE/BANDAGES/DRESSINGS) ×1
DECANTER SPIKE VIAL GLASS SM (MISCELLANEOUS) IMPLANT
DRAPE INCISE IOBAN 66X45 STRL (DRAPES) ×3 IMPLANT
DRAPE SHOULDER BEACH CHAIR (DRAPES) ×3 IMPLANT
DRAPE U 20/CS (DRAPES) ×3 IMPLANT
DRAPE U-SHAPE 47X51 STRL (DRAPES) ×3 IMPLANT
DURAPREP 26ML APPLICATOR (WOUND CARE) ×3 IMPLANT
ELECT REM PT RETURN 9FT ADLT (ELECTROSURGICAL)
ELECTRODE REM PT RTRN 9FT ADLT (ELECTROSURGICAL) IMPLANT
GLOVE BIO SURGEON STRL SZ8 (GLOVE) ×6 IMPLANT
GLOVE BIOGEL PI IND STRL 7.0 (GLOVE) ×1 IMPLANT
GLOVE BIOGEL PI IND STRL 8 (GLOVE) ×2 IMPLANT
GLOVE BIOGEL PI INDICATOR 7.0 (GLOVE) ×2
GLOVE BIOGEL PI INDICATOR 8 (GLOVE) ×4
GLOVE ECLIPSE 6.5 STRL STRAW (GLOVE) ×3 IMPLANT
GLOVE ORTHO TXT STRL SZ7.5 (GLOVE) ×3 IMPLANT
GOWN STRL REUS W/ TWL LRG LVL3 (GOWN DISPOSABLE) ×1 IMPLANT
GOWN STRL REUS W/TWL LRG LVL3 (GOWN DISPOSABLE) ×2
GOWN STRL REUS W/TWL XL LVL3 (GOWN DISPOSABLE) ×6 IMPLANT
IMMOBILIZER SHOULDER XLGE (ORTHOPEDIC SUPPLIES) IMPLANT
IV NS IRRIG 3000ML ARTHROMATIC (IV SOLUTION) ×9 IMPLANT
KIT SHOULDER TRACTION (DRAPES) ×3 IMPLANT
LASSO SUT 90 DEGREE (SUTURE) IMPLANT
PACK ARTHROSCOPY DSU (CUSTOM PROCEDURE TRAY) ×3 IMPLANT
PACK BASIN DAY SURGERY FS (CUSTOM PROCEDURE TRAY) ×3 IMPLANT
PAD ABD 8X10 STRL (GAUZE/BANDAGES/DRESSINGS) ×3 IMPLANT
SET ARTHROSCOPY TUBING (MISCELLANEOUS) ×2
SET ARTHROSCOPY TUBING LN (MISCELLANEOUS) ×1 IMPLANT
SHEET MEDIUM DRAPE 40X70 STRL (DRAPES) ×3 IMPLANT
SLEEVE SCD COMPRESS KNEE MED (MISCELLANEOUS) ×3 IMPLANT
SLING ARM FOAM STRAP MED (SOFTGOODS) IMPLANT
SLING ARM IMMOBILIZER LRG (SOFTGOODS) IMPLANT
SLING ARM IMMOBILIZER MED (SOFTGOODS) IMPLANT
SLING ARM LRG ADULT FOAM STRAP (SOFTGOODS) IMPLANT
SLING ARM XL FOAM STRAP (SOFTGOODS) IMPLANT
SPONGE GAUZE 4X4 12PLY (GAUZE/BANDAGES/DRESSINGS) ×3 IMPLANT
STRIP CLOSURE SKIN 1/2X4 (GAUZE/BANDAGES/DRESSINGS) ×2 IMPLANT
SUT FIBERWIRE #2 38 T-5 BLUE (SUTURE)
SUT LASSO 45 DEGREE (SUTURE) IMPLANT
SUT LASSO 45 DEGREE LEFT (SUTURE) IMPLANT
SUT LASSO 45D RIGHT (SUTURE) IMPLANT
SUT MNCRL AB 4-0 PS2 18 (SUTURE) ×3 IMPLANT
SUT PDS AB 1 CT  36 (SUTURE)
SUT PDS AB 1 CT 36 (SUTURE) IMPLANT
SUT TIGER TAPE 7 IN WHITE (SUTURE) IMPLANT
SUT VIC AB 3-0 SH 27 (SUTURE)
SUT VIC AB 3-0 SH 27X BRD (SUTURE) IMPLANT
SUTURE FIBERWR #2 38 T-5 BLUE (SUTURE) IMPLANT
TAPE FIBER 2MM 7IN #2 BLUE (SUTURE) IMPLANT
TOWEL OR 17X24 6PK STRL BLUE (TOWEL DISPOSABLE) ×3 IMPLANT
TOWEL OR NON WOVEN STRL DISP B (DISPOSABLE) ×3 IMPLANT
TUBE CONNECTING 20'X1/4 (TUBING)
TUBE CONNECTING 20X1/4 (TUBING) IMPLANT
WAND STAR VAC 90 (SURGICAL WAND) ×3 IMPLANT
WATER STERILE IRR 1000ML POUR (IV SOLUTION) ×3 IMPLANT

## 2013-10-03 NOTE — Progress Notes (Signed)
Assisted Dr. Ossey with right, ultrasound guided, interscalene  block. Side rails up, monitors on throughout procedure. See vital signs in flow sheet. Tolerated Procedure well. 

## 2013-10-03 NOTE — Anesthesia Preprocedure Evaluation (Signed)
Anesthesia Evaluation  Patient identified by MRN, date of birth, ID band Patient awake    Reviewed: Allergy & Precautions, H&P , NPO status , Patient's Chart, lab work & pertinent test results  Airway Mallampati: I TM Distance: >3 FB Neck ROM: Full    Dental   Pulmonary Current Smoker,          Cardiovascular     Neuro/Psych Seizures -, Well Controlled,  Previous head Injury now stable.    GI/Hepatic   Endo/Other    Renal/GU      Musculoskeletal   Abdominal   Peds  Hematology   Anesthesia Other Findings   Reproductive/Obstetrics                           Anesthesia Physical Anesthesia Plan  ASA: III  Anesthesia Plan: General   Post-op Pain Management:    Induction: Intravenous  Airway Management Planned: Oral ETT  Additional Equipment:   Intra-op Plan:   Post-operative Plan: Extubation in OR  Informed Consent: I have reviewed the patients History and Physical, chart, labs and discussed the procedure including the risks, benefits and alternatives for the proposed anesthesia with the patient or authorized representative who has indicated his/her understanding and acceptance.     Plan Discussed with: CRNA and Surgeon  Anesthesia Plan Comments:         Anesthesia Quick Evaluation

## 2013-10-03 NOTE — Transfer of Care (Signed)
Immediate Anesthesia Transfer of Care Note  Patient: John Howe  Procedure(s) Performed: Procedure(s): RIGHT ARTHROSCOPY SHOULDER WITH EXTENSIVE DEBRIDMENT, acromioplasty (Right)  Patient Location: PACU  Anesthesia Type:GA combined with regional for post-op pain  Level of Consciousness: awake, sedated and patient cooperative  Airway & Oxygen Therapy: Patient Spontanous Breathing and Patient connected to face mask oxygen  Post-op Assessment: Report given to PACU RN and Post -op Vital signs reviewed and stable  Post vital signs: Reviewed and stable  Complications: No apparent anesthesia complications

## 2013-10-03 NOTE — Anesthesia Postprocedure Evaluation (Signed)
Anesthesia Post Note  Patient: John Howe  Procedure(s) Performed: Procedure(s) (LRB): RIGHT ARTHROSCOPY SHOULDER WITH EXTENSIVE DEBRIDMENT, acromioplasty (Right)  Anesthesia type: general  Patient location: PACU  Post pain: Pain level controlled  Post assessment: Patient's Cardiovascular Status Stable  Last Vitals:  Filed Vitals:   10/03/13 1530  BP:   Pulse: 56  Temp:   Resp: 15    Post vital signs: Reviewed and stable  Level of consciousness: sedated  Complications: No apparent anesthesia complications

## 2013-10-03 NOTE — Op Note (Signed)
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  PROCEDURE:  RIGHT ARTHROSCOPY SHOULDER WITH EXTENSIVE DEBRIDMENT, acromioplasty  SURGEON:  Eulas PostLANDAU,Dezi Brauner P, MD  PHYSICIAN ASSISTANT: Janace LittenBrandon Parry, OPA-C, present and scrubbed throughout the case, critical for completion in a timely fashion, and for retraction, instrumentation, and closure.  ANESTHESIA:   General  PREOPERATIVE INDICATIONS:  John Howe is a  43 y.o. male who complained of chronic right shoulder pain, failed conservative measures, had stiffness and loss of function.  The risks benefits and alternatives were discussed with the patient preoperatively including but not limited to the risks of infection, bleeding, nerve injury, cardiopulmonary complications, the need for revision surgery, among others, and the patient was willing to proceed.  OPERATIVE IMPLANTS: None  OPERATIVE FINDINGS: The biceps tendon was absent. The glenohumeral articular cartilage was still reasonably well maintained, with no evidence for full-thickness chondral loss, although there were some areas on the top of the glenoid, and on a small portion of the humeral head had grade 3 chondral changes, but certainly not enough to warrant arthroplasty. The subscapularis was difficult to appreciate, and appeared as one sheet of scar tissue anteriorly. The greater tuberosity was not in a normal position, and slightly elevated, did appear to have healed from a previous fracture, and the rotator cuff was still attached, although there were substantial subacromial adhesions. There is also a fairly sizable type III acromion, with CA ligament fraying. The rotator cuff was attached superiorly and I did not find any significant bursal  sided tearing.  OPERATIVE PROCEDURE: The patient was brought to the operating room and placed in the supine position. General anesthesia was administered. IV antibiotics were given. The right upper extremity was examined, and had significant stiffness. I really couldn't get a much higher than about 140. He externally rotated to neutral only. This did not feel like a frozen shoulder.  The right upper extremity was prepped and draped in usual sterile fashion, and time out was performed, and diagnostic arthroscopy was carried out with the above-named findings. I used the arthroscopic shaver to debride the labrum, as well as the superior labral attachment where the biceps used to be, and also some frayed chondral tissue around the glenoid. Again, I didn't find any full thickness chondral loss, but this was certainly a very abnormal shoulder.  I switched portals, and viewed from the anterior portal, and debrided any posterior abnormal tissue.  I then went into the subacromial space, which was very thickened, with hypertrophic bursa, and multiple adhesions. These were taken down, and the CA ligament was released, and the subacromial spur excised with a bur.  I viewed the rotator cuff from the lateral side, and it was found all be intact, and so then I removed the endoscopic instruments, and repaired the portals with Monocryl followed by Steri-Strips and sterile gauze.  He was awakened and returned to the PACU in stable and satisfactory condition. There were no complications and he tolerated the procedure well. He certainly had a strange morphology for the entirety of his shoulder, although I do not think that his cartilage is bad enough to warrant shoulder replacement. and he

## 2013-10-03 NOTE — Discharge Instructions (Signed)
Surgery for Impingement Syndrome, Subacromial Decompression Subacromial decompression surgery is for patients with rotator cuff tendinitis, subacromial bursitis (inflamed fluid filled sac between the shoulder joint and top of the shoulder blade), or impingement syndrome (inflamed rotator cuff tendons, due to pinching). Surgery is for patients with continued shoulder pain, despite at least 3 months of rehabilitation treatment. The shoulder pain is so severe that it affects patients' daily activities or greatly decreases their quality of life. Patients who will benefit most from surgery are those whose shoulder bone (acromion) has a curve, hook, or bump (spur), or those who have a partial rotator cuff tear. There are 3 purposes of surgery. First, the inflamed bursa is removed. Second, the shoulder bone defect (curve, hook, spur) is removed. Third, the coracoacromial ligament is cut. This surgery is intended to reduce pain, by increasing space in the area, so that the rotator cuff is less likely to be pinched. REASONS NOT TO OPERATE   Infection of the shoulder joint.  Patient is unable or unwilling to complete the postoperative program. This includes keeping the shoulder in a sling or immobilizer (if open surgery is performed), or performing the needed rehabilitation.  Emotional or psychological conditions that contribute to the shoulder condition.  Patients who have rotator cuff inflammation due to other causes. This includes impingement caused by shoulder instability, weak shoulder blade muscles (scapula), shoulder arthritis, stiff or frozen shoulder, or a large os acromionale (failure of the shoulder bone growth plates to fuse properly). RISKS AND COMPLICATIONS   Infection.  Bleeding.  Injury to nerves (numbness, weakness, paralysis).  Continued or recurring pain.  Detachment of the deltoid shoulder muscle (if open surgery is performed).  Stiffness or loss of shoulder motion.  Decrease in  athletic performance.  Shoulder weakness.  Fracture of the shoulder bone.  Pain in the joint connecting the shoulder bone and collarbone.  Removal of too much or too little shoulder bone. TECHNIQUE Technique used may vary between surgeons. In general, the surgery is performed with a flexible tube and tools inserted in a few small slits near the joint (arthroscopic). It may also be completed through an open cut (incision). The goal of the procedure is to remove the bursa, remove the shoulder bone deformity, and cut the coracoacromial ligament. Electricity will be used to sear the small capillaries (cauterize), to stop small amounts of bleeding. Other tools used are an electric or motorized shaver, to remove the bursa, and a small power drill (burr) to remove the deformity of the shoulder bone.  If the procedure is completed with an open incision, the surgeon will detach the deltoid shoulder muscle from the shoulder bone and cut the coracoacromial ligament. The deformity of the shoulder bone is then removed, using a saw or chisel (osteotome). A file (rasp) may be used to smooth the edges. Finally, the bursa is removed with scissors, and the deltoid muscle is reattached to the shoulder bone.  HOME CARE INSTRUCTIONS   Postoperative care depends on the surgical technique used (arthroscopic or open).  Follow the instructions given to you by your surgeon.  Keep the wound clean and dry for 10 to 14 days after surgery, especially if open surgery is performed.  Wear a sling, brace, or immobilizer as prescribed by your surgeon. This often lasts a couple days for arthroscopic procedures, or 6 to 8 weeks for open procedures, because the deltoid muscle must heal.  You will be given pain medicines by your caregiver or surgeon. Take only as much medicine  as you need.  You may be advised to perform motion exercises immediately after surgery. These may be performed at home or with a therapist.  Postoperative  rehabilitation and exercises are very important to regain motion, and later, strength. RETURN TO SPORTS   6 weeks is the minimum waiting time required before returning to play. Open procedure surgeries are often longer.  Return to sports depends on the type of sport and the position played.  A therapist must assess your strength and range of motion before athletics may be resumed. SEEK MEDICAL CARE IF:   You experience pain, numbness, or coldness in the hand.  Blue, gray, or dark color appears in the fingernails.  Any of the following occur after surgery:  Increased pain, swelling, redness, drainage of fluids, or bleeding in the affected area.  Signs of infection (headache, muscle aches, dizziness, a general ill feeling with fever).  New, unexplained symptoms develop. (Drugs used in treatment may produce side effects.) Do not eat or drink anything before surgery. Solid food makes general anesthesia more hazardous.  Document Released: 09/11/2005 Document Revised: 12/04/2011 Document Reviewed: 12/24/2008 [facility] Patient Information 2014 [place], [place].  Diet: As you were doing prior to hospitalization   Shower:  May shower but keep the wounds dry, use an occlusive plastic wrap, NO SOAKING IN TUB.  If the bandage gets wet, change with a clean dry gauze.  Dressing:  You may change your dressing 3-5 days after surgery.  Then change the dressing daily with sterile gauze dressing.    There are sticky tapes (steri-strips) on your wounds and all the stitches are absorbable.  Leave the steri-strips in place when changing your dressings, they will peel off with time, usually 2-3 weeks.  Activity:  Increase activity slowly as tolerated, but follow the weight bearing instructions below.  No lifting or driving for 6 weeks.  Weight Bearing:   As tolerated.    To prevent constipation: you may use a stool softener such as -  Colace (over the counter) 100 mg by mouth twice a day  Drink plenty  of fluids (prune juice may be helpful) and high fiber foods Miralax (over the counter) for constipation as needed.    Itching:  If you experience itching with your medications, try taking only a single pain pill, or even half a pain pill at a time.  You may take up to 10 pain pills per day, and you can also use benadryl over the counter for itching or also to help with sleep.   Precautions:  If you experience chest pain or shortness of breath - call 911 immediately for transfer to the hospital emergency department!!  If you develop a fever greater that 101 F, purulent drainage from wound, increased redness or drainage from wound, or calf pain -- Call the office at [phone]                                                Follow- Up Appointment:  Please call for an appointment to be seen in 2 weeks [place] - [phone]   Post Anesthesia Home Care Instructions  Activity: Get plenty of rest for the remainder of the day. A responsible adult should stay with you for 24 hours following the procedure.  For the next 24 hours, DO NOT: -Drive a car -Advertising copywriter -Drink alcoholic beverages -Take  any medication unless instructed by your physician -Make any legal decisions or sign important papers.  Meals: Start with liquid foods such as gelatin or soup. Progress to regular foods as tolerated. Avoid greasy, spicy, heavy foods. If nausea and/or vomiting occur, drink only clear liquids until the nausea and/or vomiting subsides. Call your physician if vomiting continues.  Special Instructions/Symptoms: Your throat may feel dry or sore from the anesthesia or the breathing tube placed in your throat during surgery. If this causes discomfort, gargle with warm salt water. The discomfort should disappear within 24 hours. Regional Anesthesia Blocks  1. Numbness or the inability to move the "blocked" extremity may last from 3-48 hours after placement. The length of time depends on the medication  injected and your individual response to the medication. If the numbness is not going away after 48 hours, call your surgeon.  2. The extremity that is blocked will need to be protected until the numbness is gone and the  Strength has returned. Because you cannot feel it, you will need to take extra care to avoid injury. Because it may be weak, you may have difficulty moving it or using it. You may not know what position it is in without looking at it while the block is in effect.  3. For blocks in the legs and feet, returning to weight bearing and walking needs to be done carefully. You will need to wait until the numbness is entirely gone and the strength has returned. You should be able to move your leg and foot normally before you try and bear weight or walk. You will need someone to be with you when you first try to ensure you do not fall and possibly risk injury.  4. Bruising and tenderness at the needle site are common side effects and will resolve in a few days.  5. Persistent numbness or new problems with movement should be communicated to the surgeon or the [facility] [phone])/ [facility] [phone]
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
## 2013-10-03 NOTE — Anesthesia Procedure Notes (Addendum)
Anesthesia Regional Block:  Interscalene brachial plexus block  Pre-Anesthetic Checklist: ,, timeout performed, Correct Patient, Correct Site, Correct Laterality, Correct Procedure, Correct Position, site marked, Risks and benefits discussed,  Surgical consent,  Pre-op evaluation,  At surgeon's request and post-op pain management  Laterality: Right  Prep: chloraprep       Needles:  Injection technique: Single-shot  Needle Type: Echogenic Stimulator Needle     Needle Length: 9cm  Needle Gauge: 21 and 21 G    Additional Needles:  Procedures: ultrasound guided (picture in chart) and nerve stimulator Interscalene brachial plexus block  Nerve Stimulator or Paresthesia:  Response: 0.4 mA,   Additional Responses:   Narrative:  Start time: 10/03/2013 12:25 PM End time: 10/03/2013 12:40 PM Injection made incrementally with aspirations every 5 mL.  Performed by: Personally  Anesthesiologist: Arta BruceKevin Ossey MD  Additional Notes: Monitors applied. Patient sedated. Sterile prep and drape,hand hygiene and sterile gloves were used. Relevant anatomy identified.Needle position confirmed.Local anesthetic injected incrementally after negative aspiration. Local anesthetic spread visualized around nerve(s). Vascular puncture avoided. No complications. Image printed for medical record.The patient tolerated the procedure well.        Procedure Name: Intubation Date/Time: 10/03/2013 1:12 PM Performed by: Gar GibbonKEETON, Baili Stang S Pre-anesthesia Checklist: Patient identified, Emergency Drugs available, Suction available and Patient being monitored Patient Re-evaluated:Patient Re-evaluated prior to inductionOxygen Delivery Method: Circle System Utilized Preoxygenation: Pre-oxygenation with 100% oxygen Intubation Type: IV induction Ventilation: Mask ventilation without difficulty Laryngoscope Size: Mac and 4 Grade View: Grade III Tube type: Oral Tube size: 8.0 mm Number of attempts: 2 Airway Equipment and  Method: stylet and oral airway Placement Confirmation: ETT inserted through vocal cords under direct vision,  positive ETCO2 and breath sounds checked- equal and bilateral Secured at: 22 cm Tube secured with: Tape Dental Injury: Teeth and Oropharynx as per pre-operative assessment  Comments: First attempt with Miller 3, no structures identified. Second attempt with Mac 4.

## 2013-10-06 ENCOUNTER — Encounter (HOSPITAL_BASED_OUTPATIENT_CLINIC_OR_DEPARTMENT_OTHER): Payer: Self-pay | Admitting: Orthopedic Surgery

## 2014-01-20 ENCOUNTER — Encounter (HOSPITAL_COMMUNITY): Payer: Self-pay | Admitting: Emergency Medicine

## 2014-01-20 ENCOUNTER — Emergency Department (HOSPITAL_COMMUNITY)
Admission: EM | Admit: 2014-01-20 | Discharge: 2014-01-21 | Disposition: A | Discharge status: Home / Self Care | Payer: Medicaid Other | Attending: Emergency Medicine | Admitting: Emergency Medicine

## 2014-01-20 DIAGNOSIS — Z87828 Personal history of other (healed) physical injury and trauma: Secondary | ICD-10-CM | POA: Insufficient documentation

## 2014-01-20 DIAGNOSIS — Z880 Allergy status to penicillin: Secondary | ICD-10-CM | POA: Insufficient documentation

## 2014-01-20 DIAGNOSIS — M25519 Pain in unspecified shoulder: Secondary | ICD-10-CM | POA: Insufficient documentation

## 2014-01-20 DIAGNOSIS — F17200 Nicotine dependence, unspecified, uncomplicated: Secondary | ICD-10-CM | POA: Insufficient documentation

## 2014-01-20 DIAGNOSIS — Z9889 Other specified postprocedural states: Secondary | ICD-10-CM | POA: Insufficient documentation

## 2014-01-20 DIAGNOSIS — Z8659 Personal history of other mental and behavioral disorders: Secondary | ICD-10-CM | POA: Insufficient documentation

## 2014-01-20 DIAGNOSIS — Z8679 Personal history of other diseases of the circulatory system: Secondary | ICD-10-CM | POA: Insufficient documentation

## 2014-01-20 DIAGNOSIS — R45850 Homicidal ideations: Secondary | ICD-10-CM

## 2014-01-20 DIAGNOSIS — G8929 Other chronic pain: Secondary | ICD-10-CM

## 2014-01-20 DIAGNOSIS — R569 Unspecified convulsions: Secondary | ICD-10-CM

## 2014-01-20 DIAGNOSIS — M7541 Impingement syndrome of right shoulder: Secondary | ICD-10-CM

## 2014-01-20 DIAGNOSIS — Z8669 Personal history of other diseases of the nervous system and sense organs: Secondary | ICD-10-CM | POA: Insufficient documentation

## 2014-01-20 LAB — RAPID URINE DRUG SCREEN, HOSP PERFORMED
Amphetamines: NOT DETECTED
BARBITURATES: NOT DETECTED
Benzodiazepines: NOT DETECTED
COCAINE: NOT DETECTED
Opiates: NOT DETECTED
Tetrahydrocannabinol: NOT DETECTED

## 2014-01-20 LAB — CBC
HCT: 44 % (ref 39.0–52.0)
Hemoglobin: 14.6 g/dL (ref 13.0–17.0)
MCH: 30.2 pg (ref 26.0–34.0)
MCHC: 33.2 g/dL (ref 30.0–36.0)
MCV: 91.1 fL (ref 78.0–100.0)
PLATELETS: 328 10*3/uL (ref 150–400)
RBC: 4.83 MIL/uL (ref 4.22–5.81)
RDW: 13.2 % (ref 11.5–15.5)
WBC: 6.5 10*3/uL (ref 4.0–10.5)

## 2014-01-20 LAB — ETHANOL

## 2014-01-20 LAB — BASIC METABOLIC PANEL
BUN: 14 mg/dL (ref 6–23)
CO2: 32 mEq/L (ref 19–32)
Calcium: 9.9 mg/dL (ref 8.4–10.5)
Chloride: 98 mEq/L (ref 96–112)
Creatinine, Ser: 1.16 mg/dL (ref 0.50–1.35)
GFR, EST AFRICAN AMERICAN: 87 mL/min — AB (ref >90)
GFR, EST NON AFRICAN AMERICAN: 75 mL/min — AB (ref >90)
Glucose, Bld: 82 mg/dL (ref 70–99)
POTASSIUM: 4 meq/L (ref 3.7–5.3)
Sodium: 141 mEq/L (ref 137–147)

## 2014-01-20 MED ORDER — ACETAMINOPHEN 325 MG PO TABS: 650.0000 mg | ORAL_TABLET | ORAL | Status: DC | PRN

## 2014-01-20 MED ORDER — NICOTINE 21 MG/24HR TD PT24: 21.0000 mg | MEDICATED_PATCH | Freq: Every day | TRANSDERMAL | Status: DC

## 2014-01-20 MED ORDER — IBUPROFEN 200 MG PO TABS: 600.0000 mg | ORAL_TABLET | Freq: Three times a day (TID) | ORAL | Status: DC | PRN

## 2014-01-20 MED ORDER — ONDANSETRON HCL 4 MG PO TABS: 4.0000 mg | ORAL_TABLET | Freq: Three times a day (TID) | ORAL | Status: DC | PRN

## 2014-01-20 MED ORDER — LORAZEPAM 1 MG PO TABS: 1.0000 mg | ORAL_TABLET | Freq: Three times a day (TID) | ORAL | Status: DC | PRN

## 2014-01-20 MED ORDER — ZOLPIDEM TARTRATE 5 MG PO TABS: 5.0000 mg | ORAL_TABLET | Freq: Every evening | ORAL | Status: DC | PRN

## 2014-01-20 MED ORDER — ALUM & MAG HYDROXIDE-SIMETH 200-200-20 MG/5ML PO SUSP: 30.0000 mL | ORAL | Status: DC | PRN

## 2014-01-20 NOTE — ED Notes (Signed)
Pt states that he "heard this [place] in the [place]."  Pt states he is tired to seeing his face.  Pt denies seeing him but states he heard him while he had his head under the covers as he's laying in the hallway bed.  Pt describes this John Howe person as a "light skinned dude" who is his roommates brother in law.  When asked if this is the person he would like to hurt, pt states, "No I don't want to hurt him."  Pt reassured that no person named John Howe was here.  Pt put is head back under the sheets again.  Pt currently resting.
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
## 2014-01-20 NOTE — ED Notes (Signed)
Pt was by himself in lobby when called pt back to Triage room.  Pt answered al my questions and denied being SI or HI.  When finishing up with pt's triage, visitor was in hallway/doorway. Visitor never volunteered any information on why pt was here and walked with pt and in back to lobby to wait for available bed.

## 2014-01-20 NOTE — ED Notes (Signed)
Pt c/o right shoulder pain. Pt states that he had surgery on it 3 months ago and has been bothering him since.

## 2014-01-20 NOTE — ED Notes (Addendum)
Pt belongings are at nurses station. 1 Pair of brown tennis shoes, 1 Pair of blue jeans, 1 pair of blue printed boxers and a brown t- shirt.. Two cell phones one red small cluch

## 2014-01-20 NOTE — ED Notes (Signed)
Group home counselor stated that during his assessment, pt reports HI towards a roommate.

## 2014-01-20 NOTE — ED Notes (Signed)
If pt is to be discharged, we are to contact Beverely PaceEdurado Turnbull of [employer] services: [phone]

## 2014-01-20 NOTE — ED Notes (Signed)
Pt ambulatory to exam room with group home counselor. Pt calm, cooperative. Group home counselor states pt has been having homicidal thoughts.

## 2014-01-21 ENCOUNTER — Encounter (HOSPITAL_COMMUNITY): Payer: Self-pay | Admitting: Registered Nurse

## 2014-01-21 DIAGNOSIS — M79609 Pain in unspecified limb: Secondary | ICD-10-CM

## 2014-01-21 DIAGNOSIS — R45850 Homicidal ideations: Secondary | ICD-10-CM

## 2014-01-21 NOTE — Discharge Instructions (Signed)
No-harm Safety Contract  A no-harm safety contract is a written or verbal agreement between you and a mental health professional to promote safety. It contains specific actions and promises you agree to. The agreement also includes instructions from the therapist or doctor. The instructions will help prevent you from harming yourself or harming others. Harm can be as mild as pinching yourself, but can increase in intensity to actions like burning or cutting yourself. The extreme level of self-harm would be committing suicide. No-harm safety contracts are also sometimes referred to as a Charity fundraiserno-suicide contract, suicide Financial controllerprevention contract, no-harm agreements or decisions, or a Engineer, manufacturing systemssafety contract.  REASONS FOR NO-HARM SAFETY CONTRACTS Safety contracts are just one part of an overall treatment plan to help keep you safe and free of harm. A safety contract may help to relieve anxiety, restore a sense of control, state clearly the alternatives to harm or suicide, and give you and your therapist or doctor a gauge for how you are doing in between visits. Many factors impact the decision to use a no-harm safety contract and its effectiveness. A proper overall treatment plan and evaluation and good patient understanding are the keys to good outcomes. CONTRACT ELEMENTS  A contract can range from simple to complex. They include all or some of the following:  Action statements. These are statements you agree to do or not do. Example: If I feel my life is becoming too difficult, I agree to do the following so there is no harm to myself or others:  Talk with family or friends.  Rid myself of all things that I could use to harm myself.  Do an activity I enjoy or have enjoyed in the recent past. Coping strategies. These are ways to think and feel that decrease stress, such as:  Use of affirmations or positive statements about self.  Good self-care, including improved grooming, and healthy eating, and healthy sleeping  patterns.  Increase physical exercise.  Increase social involvement.  Focus on positive aspects of life. Crisis management. This would include what to do if there was trouble following the contract or an urge to harm. This might include notifying family or your therapist of suicidal thoughts. Be open and honest about suicidal urges. To prevent a crisis, do the following:  List reasons to reach out for support.  Keep contact numbers and available hours handy. Treatment goals. These are goals would include no suicidal thoughts, improved mood, and feelings of hopefulness. Listed responsibilities of different people involved in care. This could include family members. A family member may agree to remove firearms or other lethal weapons/substances from your ease of access. A timeline. A timeline can be in place from one therapy session to the next session. HOME CARE INSTRUCTIONS   Follow your no-harm safety contract.  Contact your therapist and/or doctor if you have any questions or concerns. MAKE SURE YOU:   Understand these instructions.  Will watch your condition. Noticing any mood changes or suicidal urges.  Will get help right away if you are not doing well or get worse. Document Released: 03/01/2010 Document Revised: 12/04/2011 Document Reviewed: 03/01/2010 [facility] Patient Information 2014 [employer], [place].
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
## 2014-01-21 NOTE — ED Notes (Signed)
Discharged to [facility]umpEdu[facility] Compliance Director. [facility]

## 2014-01-21 NOTE — ED Notes (Signed)
Bed: WA22 Expected date:  Expected time:  Means of arrival:  Comments: 

## 2014-01-21 NOTE — ED Notes (Addendum)
MD at bedside.  NP AT BS EVALUATING THIS PT

## 2014-01-21 NOTE — BHH Counselor (Addendum)
Writer spoke w/ Sharyne PeachEd Turnbull. Ed sts that pt is in daily Psychosocial Rehab through [facility] and also sees their therapist. Ed sts he thinks pt isn't a danger to himself or anyone else. Ed will be here soon to take pt home.   Evette Cristalaroline Paige Brendy Ficek, [place] Assessment Counselor 2:30 pm    Writer spoke w/ Advertising account executiveadmin assistant at [facility]. Beverely PaceEdurado Turnbull will call writer back - [phone].  Evette Cristalaroline Paige Lianne Carreto, [place] Assessment Counselor 2:00 pm

## 2014-01-21 NOTE — ED Notes (Signed)
Bed: WA30 Expected date:  Expected time:  Means of arrival:  Comments: [facility] 

## 2014-01-22 NOTE — Consult Note (Signed)
Face to face evaluation and I agree with this note 

## 2014-03-25 HISTORY — PX: EYE SURGERY: SHX253

## 2014-06-25 ENCOUNTER — Ambulatory Visit (INDEPENDENT_AMBULATORY_CARE_PROVIDER_SITE_OTHER): Payer: Medicaid Other | Admitting: Nurse Practitioner

## 2014-06-25 ENCOUNTER — Encounter: Payer: Self-pay | Admitting: Nurse Practitioner

## 2014-06-25 VITALS — BP 115/71 | HR 95 | Temp 97.8°F | Wt 187.0 lb

## 2014-06-25 DIAGNOSIS — G40909 Epilepsy, unspecified, not intractable, without status epilepticus: Secondary | ICD-10-CM
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
## 2014-06-25 NOTE — Patient Instructions (Signed)
You are doing well on no medication, so we will continue to just watch you with a yearly visit.  Managing Seizure Triggers: Tips for Lifestyle Modification Adapted from the [facility], [facility], [place], [place] and the journal "Clinical Nursing Practice in Epilepsy", Spring 1994.  Developing plans to modify your lifestyle is an important part of seizure preparedness. It's a way that you, as a person with seizures or a parent of a child with seizures, can take charge and play an active role in your epilepsy care. The following tips are examples of what people can do to manage triggers. Some of these tips may require a change in behavior, others may be ways to adjust your environment or schedule so not everything happens at once. Before choosing tips to try, make sure you've assessed your situation and talked to your doctor and other health care professionals for their suggestions too. Please note that research on the effectiveness of many of these techniques is limited. Many of these tips are common sense suggestions or are from health care professionals and people with epilepsy as to what they have seen and tried.  Noises: People who think they are affected by noises should be sure to talk to their doctor about whether they have a form of 'reflex epilepsy' or if general noise or distraction may be a trigger in another way. People with true reflex epilepsy may respond to specific seizure medicines and should talk to their doctor. Try using earplugs or earphones, especially in noisy or crowded places. Try listening to relaxing music or sounds, or try distracting yourself by singing or focusing on another activity.  Bright, flashing or fluorescent lights: Use polarized or tinted glasses. Use natural lighting when indoors. Focus on distant objects when riding in a car to avoid flickering lights or patterns. Avoid discos, strobe lights or flashing  bulbs on holiday decorations. Use computer monitor with minimal contrast glare or use a screen filter. Consult with your doctor about other specific recommendations for computer use.  Sleep: Try to regulate sleeping habits so you have a consistent schedule and get enough sleep. Keep a log or diary of your sleep patterns, seizures and general well-being. Ask a partner or companion to record his or her observations too. Consider the following ideas to improve sleep.  . Discuss your medicine schedule with your doctor or nurse. Changing times or doses at night may help sleep. . Limit caffeine and try to avoid it after noon time or mid?afternoon at the latest. . Avoid alcohol and nicotine prior to sleep. . Limit working or studying late at night. Stop work at least one hour before bedtime to allow time to relax. . Exercise in the early evening if possible. . Take warm showers or have someone give you a back rub before bedtime to decrease muscle tension. . Try relaxation exercises before bedtime. . Limit naps and don't nap in the early evening. . If anxious or worried, talk to someone or write down your feelings before going to sleep. Put this away and deal with these worries or concerns in the morning! . If you can't fall asleep within 15 minutes get up and do something else for 15 minutes. Then go back to bed and try again. Don't toss and turn in bed all night.  Exercise: Regular exercise is good for everyone. Pace your exercise to avoid getting too tired or hyperventilation. Avoid exercising in the middle of the day during hot weather. Ask your  doctor about any specific exercises you may need to avoid.  Hyperventilation: Try relaxation or slow breathing exercises when anxious or if you begin to hyperventilate. Pace your activity and avoid sports that may trigger hyperventilation.  Diet: Regulate meal times and patterns around sleep, activity, and medication schedules. Usually  taking medicines after food or around meals makes it easier to remember them and may lessen any stomach distress from side effects of medicines. Have a well-balanced diet and eat at consistent times to avoid long periods without food. If your appetite is poor, try small frequent meals instead of skipping meals. Avoid foods and drinks that may aggravate seizures. Not everyone is sensitive to foods, but if you are, talk to your doctor about how to modify your diet. If you are following a diet specifically for your epilepsy, be sure to follow the advice of your doctor and nutritionist.  Alcohol/Drugs: Avoid recreational drugs and talk to your doctor about use of alcohol. Avoid alcohol completely if you're going through high-risk times or have recently had surgery. If you choose to drink alcohol, use 'moderation', drink slowly, and have only one or two glasses at a time. Consider carefully what you drink, avoiding 'hard liquor' or mixed drinks that may have high alcohol content. If alcohol and drugs are a problem for you, talk to your doctor and get professional help.  Hormonal changes: Both men and women may notice a cyclical pattern to their seizures. Record seizures on a calendar and track them in relation to any changes in hormones. Women who are having menstrual cycles should track their cycle days. Women who have stopped having their menses should track other symptoms or changes, while women who are pregnant should track their pregnancy too. The use of hormonal medicines, such as contraceptives or birth control pills as well as hormonal replacement therapy, may affect seizures in some women, so record the dates and doses of these medicines.  NOTE: some seizure medicines may interfere with the effectiveness of hormonal contraceptives making unexpected or unplanned pregnancy more likely. Be sure to talk to your doctor about all contraceptive use.When seizures cluster around menses or hormone changes,  women should try to modify their lifestyle so other triggers don't occur during this high-risk time. Some women may use 'as needed' medicines to help treat seizures associated with menses. Note the use of these on your calendars and seizure preparedness plan.  Illness, fever, trauma: Notify your doctor if you become ill, have a fever, injure yourself seriously, or need other medicines such as antibiotics, painkillers, or cold medicines. Some people may notice that certain medicines can trigger seizures or interfere with seizure medicines. Fevers, other illnesses and injuries may also make you more susceptible and you'll need to monitor your seizures carefully. Try to limit other triggers during these times and talk to your doctor about what medicines you can use.  Stress, anxiety, depression: Emotional stress is a common trigger for some people, and stress can be a cause and symptom of mood problems such as anxiety and depression. Track your stress level and mood in relation to your seizures on your diary. During stressful times, consider ways to modify your lifestyle and manage stress better. . Try counseling to help cope with seizures or other problems. . Consider support groups for epilepsy, or groups for stress management, therapy, and other support. . Write down feelings in a diary on a regular basis. It helps you get feelings out, rather than hold them in, and can help  you see the issues more clearly. . Use 'time-out' periods. Just like kids may need a time-out when they are overwhelmed or acting out, so too do adults. Giving yourself a time-out allows you to take a step back from the stressor or situation and think about how best to address it. . Learn relaxation exercises, deep breathing, yoga, or other strategies that help with stress and general well-being. . Tell your doctor and nurse how you feel. The effects of stress can be harmful to your seizures, and your life. When mood  changes last longer than expected, you may need help from a mental health professional too. If you feel emotionally unsafe, call your doctor or go to an emergency room to be evaluated.

## 2014-06-26 NOTE — Progress Notes (Signed)
I reviewed note and agree with plan.   Suanne MarkerVIKRAM R. Elenore Wanninger, MD 06/26/2014, 4:31 PM Certified in Neurology, Neurophysiology and Neuroimaging  [facility] [address], [address] [place], [place] [address] [phone]

## 2015-03-13 ENCOUNTER — Encounter (HOSPITAL_COMMUNITY): Payer: Self-pay

## 2015-03-13 ENCOUNTER — Emergency Department (HOSPITAL_COMMUNITY)
Admission: EM | Admit: 2015-03-13 | Discharge: 2015-03-14 | Disposition: A | Discharge status: Home / Self Care | Payer: Medicaid Other | Attending: Emergency Medicine | Admitting: Emergency Medicine

## 2015-03-13 ENCOUNTER — Emergency Department (HOSPITAL_COMMUNITY): Payer: Medicaid Other

## 2015-03-13 DIAGNOSIS — Z8739 Personal history of other diseases of the musculoskeletal system and connective tissue: Secondary | ICD-10-CM | POA: Diagnosis not present

## 2015-03-13 DIAGNOSIS — Z87828 Personal history of other (healed) physical injury and trauma: Secondary | ICD-10-CM | POA: Diagnosis not present

## 2015-03-13 DIAGNOSIS — R000 Tachycardia, unspecified: Secondary | ICD-10-CM | POA: Diagnosis not present

## 2015-03-13 DIAGNOSIS — Z720 Tobacco use: Secondary | ICD-10-CM | POA: Diagnosis not present

## 2015-03-13 DIAGNOSIS — R531 Weakness: Secondary | ICD-10-CM | POA: Insufficient documentation

## 2015-03-13 DIAGNOSIS — Z8659 Personal history of other mental and behavioral disorders: Secondary | ICD-10-CM | POA: Insufficient documentation

## 2015-03-13 DIAGNOSIS — R002 Palpitations: Secondary | ICD-10-CM | POA: Diagnosis not present

## 2015-03-13 DIAGNOSIS — Z880 Allergy status to penicillin: Secondary | ICD-10-CM | POA: Diagnosis not present

## 2015-03-13 DIAGNOSIS — Z8679 Personal history of other diseases of the circulatory system: Secondary | ICD-10-CM | POA: Diagnosis not present

## 2015-03-13 DIAGNOSIS — R079 Chest pain, unspecified: Secondary | ICD-10-CM | POA: Diagnosis present

## 2015-03-13 DIAGNOSIS — R0789 Other chest pain: Secondary | ICD-10-CM | POA: Insufficient documentation
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
## 2015-03-13 NOTE — ED Notes (Signed)
Per [facility]: Chest pain started 2 hours ago. Pt smoked weed and then 5 minutes later he started having chest pain that progressively worse. Nitro did not help the pts pain. [facility] gave, ,324 ASA, 1 nitro tab.

## 2015-03-14 LAB — CBC WITH DIFFERENTIAL/PLATELET
BASOS PCT: 0 % (ref 0–1)
Basophils Absolute: 0 10*3/uL (ref 0.0–0.1)
Eosinophils Absolute: 0.1 10*3/uL (ref 0.0–0.7)
Eosinophils Relative: 1 % (ref 0–5)
HCT: 42.9 % (ref 39.0–52.0)
Hemoglobin: 14.2 g/dL (ref 13.0–17.0)
Lymphocytes Relative: 34 % (ref 12–46)
Lymphs Abs: 2.6 10*3/uL (ref 0.7–4.0)
MCH: 30.5 pg (ref 26.0–34.0)
MCHC: 33.1 g/dL (ref 30.0–36.0)
MCV: 92.1 fL (ref 78.0–100.0)
Monocytes Absolute: 0.4 10*3/uL (ref 0.1–1.0)
Monocytes Relative: 5 % (ref 3–12)
Neutro Abs: 4.7 10*3/uL (ref 1.7–7.7)
Neutrophils Relative %: 60 % (ref 43–77)
PLATELETS: 290 10*3/uL (ref 150–400)
RBC: 4.66 MIL/uL (ref 4.22–5.81)
RDW: 13.7 % (ref 11.5–15.5)
WBC: 7.9 10*3/uL (ref 4.0–10.5)

## 2015-03-14 LAB — I-STAT TROPONIN, ED: Troponin i, poc: 0 ng/mL (ref 0.00–0.08)

## 2015-03-14 LAB — URINALYSIS, ROUTINE W REFLEX MICROSCOPIC
BILIRUBIN URINE: NEGATIVE
GLUCOSE, UA: NEGATIVE mg/dL
Hgb urine dipstick: NEGATIVE
KETONES UR: NEGATIVE mg/dL
Leukocytes, UA: NEGATIVE
NITRITE: NEGATIVE
Protein, ur: NEGATIVE mg/dL
Specific Gravity, Urine: 1.004 — ABNORMAL LOW (ref 1.005–1.030)
Urobilinogen, UA: 0.2 mg/dL (ref 0.0–1.0)
pH: 6.5 (ref 5.0–8.0)

## 2015-03-14 LAB — I-STAT CHEM 8, ED
BUN: 14 mg/dL (ref 6–20)
CALCIUM ION: 1.08 mmol/L — AB (ref 1.12–1.23)
CHLORIDE: 102 mmol/L (ref 101–111)
Creatinine, Ser: 1.3 mg/dL — ABNORMAL HIGH (ref 0.61–1.24)
Glucose, Bld: 122 mg/dL — ABNORMAL HIGH (ref 65–99)
HCT: 48 % (ref 39.0–52.0)
Hemoglobin: 16.3 g/dL (ref 13.0–17.0)
Potassium: 3.3 mmol/L — ABNORMAL LOW (ref 3.5–5.1)
Sodium: 140 mmol/L (ref 135–145)
TCO2: 22 mmol/L (ref 0–100)

## 2015-03-14 LAB — RAPID URINE DRUG SCREEN, HOSP PERFORMED
AMPHETAMINES: NOT DETECTED
Barbiturates: NOT DETECTED
Benzodiazepines: NOT DETECTED
COCAINE: NOT DETECTED
Opiates: NOT DETECTED
Tetrahydrocannabinol: NOT DETECTED

## 2015-03-14 NOTE — Discharge Instructions (Signed)
Please follow up with your primary care physician in 1-2 days. If you do not have one please call the [facility] number listed above. Please read all discharge instructions and return precautions.  ° ° °Chest Pain (Nonspecific) °It is often hard to give a specific diagnosis for the cause of chest pain. There is always a chance that your pain could be related to something serious, such as a heart attack or a blood clot in the lungs. You need to follow up with your health care provider for further evaluation. °CAUSES  °· Heartburn. °· Pneumonia or bronchitis. °· Anxiety or stress. °· Inflammation around your heart (pericarditis) or lung (pleuritis or pleurisy). °· A blood clot in the lung. °· A collapsed lung (pneumothorax). It can develop suddenly on its own (spontaneous pneumothorax) or from trauma to the chest. °· Shingles infection (herpes zoster virus). °The chest wall is composed of bones, muscles, and cartilage. Any of these can be the source of the pain. °· The bones can be bruised by injury. °· The muscles or cartilage can be strained by coughing or overwork. °· The cartilage can be affected by inflammation and become sore (costochondritis). °DIAGNOSIS  °Lab tests or other studies may be needed to find the cause of your pain. Your health care provider may have you take a test called an ambulatory electrocardiogram (ECG). An ECG records your heartbeat patterns over a 24-hour period. You may also have other tests, such as: °· Transthoracic echocardiogram (TTE). During echocardiography, sound waves are used to evaluate how blood flows through your heart. °· Transesophageal echocardiogram (TEE). °· Cardiac monitoring. This allows your health care provider to monitor your heart rate and rhythm in real time. °· Holter monitor. This is a portable device that records your heartbeat and can help diagnose heart arrhythmias. It allows your health care provider to track your heart activity for  several days, if needed. °· Stress tests by exercise or by giving medicine that makes the heart beat faster. °TREATMENT  °· Treatment depends on what may be causing your chest pain. Treatment may include: °¨ Acid blockers for heartburn. °¨ Anti-inflammatory medicine. °¨ Pain medicine for inflammatory conditions. °¨ Antibiotics if an infection is present. °· You may be advised to change lifestyle habits. This includes stopping smoking and avoiding alcohol, caffeine, and chocolate. °· You may be advised to keep your head raised (elevated) when sleeping. This reduces the chance of acid going backward from your stomach into your esophagus. °Most of the time, nonspecific chest pain will improve within 2-3 days with rest and mild pain medicine.  °HOME CARE INSTRUCTIONS  °· If antibiotics were prescribed, take them as directed. Finish them even if you start to feel better. °· For the next few days, avoid physical activities that bring on chest pain. Continue physical activities as directed. °· Do not use any tobacco products, including cigarettes, chewing tobacco, or electronic cigarettes. °· Avoid drinking alcohol. °· Only take medicine as directed by your health care provider. °· Follow your health care provider's suggestions for further testing if your chest pain does not go away. °· Keep any follow-up appointments you made. If you do not go to an appointment, you could develop lasting (chronic) problems with pain. If there is any problem keeping an appointment, call to reschedule. °SEEK MEDICAL CARE IF:  °· Your chest pain does not go away, even after treatment. °· You have a rash with blisters on your chest. °· You have a fever. °  SEEK IMMEDIATE MEDICAL CARE IF:  °· You have increased chest pain or pain that spreads to your arm, neck, jaw, back, or abdomen. °· You have shortness of breath. °· You have an increasing cough, or you cough up blood. °· You have severe back or abdominal pain. °· You feel nauseous or  vomit. °· You have severe weakness. °· You faint. °· You have chills. °This is an emergency. Do not wait to see if the pain will go away. Get medical help at once. Call your local emergency services (911 in [place] Do not drive yourself to the hospital. °MAKE SURE YOU:  °· Understand these instructions. °· Will watch your condition. °· Will get help right away if you are not doing well or get worse. °Document Released: 06/21/2005 Document Revised: 09/16/2013 Document Reviewed: 04/16/2008 °Exit[employer]® Patient Information ©2015 [employer]. This information is not intended to replace advice given to you by your health care provider. Make sure you discuss any questions you have with your health care provider. ° °

## 2015-03-14 NOTE — ED Notes (Signed)
Pt refusing to leave the hospital states if he is dc home "I will not make it".

## 2015-03-14 NOTE — ED Notes (Signed)
Pt escorted off premesis with security and [employer]

## 2015-03-14 NOTE — ED Provider Notes (Signed)
Patient care acquired from Elpidio Anis, PA-C with plan for discharge home if labs are unremarkable and tachycardia improved.  Results for orders placed or performed during the hospital encounter of 03/13/15  CBC with Differential  Result Value Ref Range   WBC 7.9 4.0 - 10.5 K/uL   RBC 4.66 4.22 - 5.81 MIL/uL   Hemoglobin 14.2 13.0 - 17.0 g/dL   HCT 65.9 93.5 - 70.1 %   MCV 92.1 78.0 - 100.0 fL   MCH 30.5 26.0 - 34.0 pg   MCHC 33.1 30.0 - 36.0 g/dL   RDW 77.9 39.0 - 30.0 %   Platelets 290 150 - 400 K/uL   Neutrophils Relative % 60 43 - 77 %   Neutro Abs 4.7 1.7 - 7.7 K/uL   Lymphocytes Relative 34 12 - 46 %   Lymphs Abs 2.6 0.7 - 4.0 K/uL   Monocytes Relative 5 3 - 12 %   Monocytes Absolute 0.4 0.1 - 1.0 K/uL   Eosinophils Relative 1 0 - 5 %   Eosinophils Absolute 0.1 0.0 - 0.7 K/uL   Basophils Relative 0 0 - 1 %   Basophils Absolute 0.0 0.0 - 0.1 K/uL  Urine rapid drug screen (hosp performed)  Result Value Ref Range   Opiates NONE DETECTED NONE DETECTED   Cocaine NONE DETECTED NONE DETECTED   Benzodiazepines NONE DETECTED NONE DETECTED   Amphetamines NONE DETECTED NONE DETECTED   Tetrahydrocannabinol NONE DETECTED NONE DETECTED   Barbiturates NONE DETECTED NONE DETECTED  Urinalysis, Routine w reflex microscopic (not at [facility])  Result Value Ref Range   Color, Urine YELLOW YELLOW   APPearance CLEAR CLEAR   Specific Gravity, Urine 1.004 (L) 1.005 - 1.030   pH 6.5 5.0 - 8.0   Glucose, UA NEGATIVE NEGATIVE mg/dL   Hgb urine dipstick NEGATIVE NEGATIVE   Bilirubin Urine NEGATIVE NEGATIVE   Ketones, ur NEGATIVE NEGATIVE mg/dL   Protein, ur NEGATIVE NEGATIVE mg/dL   Urobilinogen, UA 0.2 0.0 - 1.0 mg/dL   Nitrite NEGATIVE NEGATIVE   Leukocytes, UA NEGATIVE NEGATIVE  I-stat Chem 8, ED  Result Value Ref Range   Sodium 140 135 - 145 mmol/L   Potassium 3.3 (L) 3.5 - 5.1 mmol/L   Chloride 102 101 - 111 mmol/L   BUN 14 6 - 20 mg/dL   Creatinine, Ser 9.23 (H) 0.61 - 1.24 mg/dL   Glucose, Bld 300 (H) 65 - 99 mg/dL   Calcium, Ion 7.62 (L) 1.12 - 1.23 mmol/L   TCO2 22 0 - 100 mmol/L   Hemoglobin 16.3 13.0 - 17.0 g/dL   HCT 26.3 33.5 - 45.6 %  I-stat troponin, ED  Result Value Ref Range   Troponin i, poc 0.00 0.00 - 0.08 ng/mL   Comment 3           Dg Chest Port 1 View  03/14/2015   CLINICAL DATA:  44 year old male with 2 hr of chest pain. Initial encounter.  EXAM: PORTABLE CHEST - 1 VIEW  COMPARISON:  09/30/2010.  FINDINGS: Portable AP semi upright view at 2356 hrs. Stable lung volumes. Normal cardiac size and mediastinal contours. Allowing for portable technique, the lungs are clear. No pneumothorax or pleural effusion.  IMPRESSION: No acute cardiopulmonary abnormality.   Electronically Signed   By: Odessa Fleming M.D.   On: 03/14/2015 00:19    1. Chest pain, atypical   2. Weakness    Filed Vitals:   03/14/15 0500  BP: 128/73  Pulse: 88  Temp:  Resp: 21   Patient is to be discharged with recommendation to follow up with PCP in regards to today's hospital visit. Chest pain is not likely of cardiac or pulmonary etiology. Tachycardia improved with IVF. EKG without acute abnormalities, negative troponin, and negative CXR. Pt has been advised to stop smoking marijuana. Patient is stable at time of discharge    Francee Piccolo, PA-C 03/14/15 [id number]  Blake Divine, MD 03/15/15 [phone]

## 2015-03-14 NOTE — ED Notes (Signed)
The department is out of bus pass

## 2015-03-14 NOTE — ED Notes (Signed)
Pt is requesting bus pass

## 2015-03-15 LAB — URINE CULTURE

## 2015-06-28 ENCOUNTER — Ambulatory Visit: Payer: Medicaid Other | Admitting: Diagnostic Neuroimaging

## 2015-06-30 ENCOUNTER — Encounter: Payer: Self-pay | Admitting: Diagnostic Neuroimaging

## 2015-07-11 ENCOUNTER — Encounter (HOSPITAL_COMMUNITY): Payer: Self-pay | Admitting: *Deleted

## 2015-07-11 ENCOUNTER — Emergency Department (HOSPITAL_COMMUNITY)
Admission: EM | Admit: 2015-07-11 | Discharge: 2015-07-11 | Disposition: A | Discharge status: Home / Self Care | Payer: Medicaid Other | Attending: Emergency Medicine | Admitting: Emergency Medicine

## 2015-07-11 DIAGNOSIS — Z8739 Personal history of other diseases of the musculoskeletal system and connective tissue: Secondary | ICD-10-CM | POA: Insufficient documentation

## 2015-07-11 DIAGNOSIS — I493 Ventricular premature depolarization: Secondary | ICD-10-CM

## 2015-07-11 DIAGNOSIS — E876 Hypokalemia: Secondary | ICD-10-CM | POA: Insufficient documentation

## 2015-07-11 DIAGNOSIS — F419 Anxiety disorder, unspecified: Secondary | ICD-10-CM | POA: Diagnosis not present

## 2015-07-11 DIAGNOSIS — R008 Other abnormalities of heart beat: Secondary | ICD-10-CM | POA: Diagnosis present

## 2015-07-11 DIAGNOSIS — Z720 Tobacco use: Secondary | ICD-10-CM | POA: Diagnosis not present

## 2015-07-11 DIAGNOSIS — Z880 Allergy status to penicillin: Secondary | ICD-10-CM | POA: Insufficient documentation

## 2015-07-11 DIAGNOSIS — F1290 Cannabis use, unspecified, uncomplicated: Secondary | ICD-10-CM

## 2015-07-11 DIAGNOSIS — F1210 Cannabis abuse, uncomplicated: Secondary | ICD-10-CM | POA: Insufficient documentation

## 2015-07-11 DIAGNOSIS — Z87828 Personal history of other (healed) physical injury and trauma: Secondary | ICD-10-CM | POA: Diagnosis not present

## 2015-07-11 DIAGNOSIS — R002 Palpitations: Secondary | ICD-10-CM

## 2015-07-11 LAB — CBC
HCT: 41.3 % (ref 39.0–52.0)
Hemoglobin: 13.6 g/dL (ref 13.0–17.0)
MCH: 30.1 pg (ref 26.0–34.0)
MCHC: 32.9 g/dL (ref 30.0–36.0)
MCV: 91.4 fL (ref 78.0–100.0)
PLATELETS: 309 10*3/uL (ref 150–400)
RBC: 4.52 MIL/uL (ref 4.22–5.81)
RDW: 13.1 % (ref 11.5–15.5)
WBC: 7.7 10*3/uL (ref 4.0–10.5)

## 2015-07-11 LAB — ETHANOL: Alcohol, Ethyl (B): 61 mg/dL — ABNORMAL HIGH (ref <5)

## 2015-07-11 LAB — BASIC METABOLIC PANEL
Anion gap: 11 (ref 5–15)
BUN: 14 mg/dL (ref 6–20)
CALCIUM: 8.7 mg/dL — AB (ref 8.9–10.3)
CO2: 23 mmol/L (ref 22–32)
CREATININE: 1.24 mg/dL (ref 0.61–1.24)
Chloride: 104 mmol/L (ref 101–111)
GFR calc Af Amer: >60 mL/min (ref >60)
Glucose, Bld: 121 mg/dL — ABNORMAL HIGH (ref 65–99)
Potassium: 2.9 mmol/L — ABNORMAL LOW (ref 3.5–5.1)
SODIUM: 138 mmol/L (ref 135–145)

## 2015-07-11 LAB — RAPID URINE DRUG SCREEN, HOSP PERFORMED
AMPHETAMINES: NOT DETECTED
BENZODIAZEPINES: NOT DETECTED
Barbiturates: NOT DETECTED
COCAINE: NOT DETECTED
Opiates: NOT DETECTED
Tetrahydrocannabinol: POSITIVE — AB

## 2015-07-11 LAB — TROPONIN I
Troponin I: <0.03 ng/mL (ref <0.031)
Troponin I: <0.03 ng/mL (ref <0.031)

## 2015-07-11 MED ORDER — LORAZEPAM 1 MG PO TABS
1.0000 mg | ORAL_TABLET | Freq: Once | ORAL | Status: AC
  Administered 2015-07-11: 1 mg via ORAL
  Filled 2015-07-11: qty 1

## 2015-07-11 MED ORDER — POTASSIUM CHLORIDE CRYS ER 20 MEQ PO TBCR
40.0000 meq | EXTENDED_RELEASE_TABLET | Freq: Once | ORAL | Status: AC
  Administered 2015-07-11: 40 meq via ORAL
  Filled 2015-07-11: qty 2

## 2015-07-11 MED ORDER — SODIUM CHLORIDE 0.9 % IV BOLUS (SEPSIS)
1000.0000 mL | Freq: Once | INTRAVENOUS | Status: AC
  Administered 2015-07-11: 1000 mL via INTRAVENOUS

## 2015-07-11 MED ORDER — POTASSIUM CHLORIDE CRYS ER 20 MEQ PO TBCR
20.0000 meq | EXTENDED_RELEASE_TABLET | Freq: Once | ORAL | Status: AC
  Administered 2015-07-11: 20 meq via ORAL
  Filled 2015-07-11: qty 1

## 2015-07-11 NOTE — ED Notes (Signed)
Bed: [id number] Expected date: 07/11/15 Expected time: 5:01 PM Means of arrival: Ambulance Comments: Anxiety- "smoked weed stronger than his normal"
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
## 2015-07-11 NOTE — ED Notes (Addendum)
EMS reports pt smoked Marijuana 2 hours ago, last smoke 6 mths ago, smoked stronger than normal marijuana. Pt called stating his heart was pounding, Ambulatory on arrival stating "I'm going to die" Noted to have frequent PAC's and PVC's with tachy rate, denies pain, states, "I'm gonna die"  BP 136/70- HR 116- Resp 16 O2 sat 98, CBG 123. Admitted 40 ounce today, no other liquids today

## 2015-07-11 NOTE — ED Notes (Signed)
Attempted to get urine sample. Pt unable to get sample at present.

## 2015-07-11 NOTE — ED Notes (Signed)
Patient is resting comfortably. 

## 2015-07-11 NOTE — Discharge Instructions (Signed)
It was our pleasure to provide your ER care today - we hope that you feel better.  Rest. Drink plenty of fluids.  Avoid marijuana use.  Minimize caffeine use.  From today's labs, your potassium level is low (2.9) - eat plenty of fruits and vegetables, and follow up with primary care doctor in 1 week for recheck.  Return to ER if worse, new symptoms, fevers, chest pain, trouble breathing, persistent fast heart beat, weak/fainting, other concern.  You were given medication in the ER - no driving for the next 4 hours.   Palpitations A palpitation is the feeling that your heartbeat is irregular or is faster than normal. It may feel like your heart is fluttering or skipping a beat. Palpitations are usually not a serious problem. However, in some cases, you may need further medical evaluation. CAUSES  Palpitations can be caused by:  Smoking.  Caffeine or other stimulants, such as diet pills or energy drinks.  Alcohol.  Stress and anxiety.  Strenuous physical activity.  Fatigue.  Certain medicines.  Heart disease, especially if you have a history of irregular heart rhythms (arrhythmias), such as atrial fibrillation, atrial flutter, or supraventricular tachycardia.  An improperly working pacemaker or defibrillator. DIAGNOSIS  To find the cause of your palpitations, your health care provider will take your medical history and perform a physical exam. Your health care provider may also have you take a test called an ambulatory electrocardiogram (ECG). An ECG records your heartbeat patterns over a 24-hour period. You may also have other tests, such as:  Transthoracic echocardiogram (TTE). During echocardiography, sound waves are used to evaluate how blood flows through your heart.  Transesophageal echocardiogram (TEE).  Cardiac monitoring. This allows your health care provider to monitor your heart rate and rhythm in real time.  Holter monitor. This is a portable device that records  your heartbeat and can help diagnose heart arrhythmias. It allows your health care provider to track your heart activity for several days, if needed.  Stress tests by exercise or by giving medicine that makes the heart beat faster. TREATMENT  Treatment of palpitations depends on the cause of your symptoms and can vary greatly. Most cases of palpitations do not require any treatment other than time, relaxation, and monitoring your symptoms. Other causes, such as atrial fibrillation, atrial flutter, or supraventricular tachycardia, usually require further treatment. HOME CARE INSTRUCTIONS   Avoid:  Caffeinated coffee, tea, soft drinks, diet pills, and energy drinks.  Chocolate.  Alcohol.  Stop smoking if you smoke.  Reduce your stress and anxiety. Things that can help you relax include:  A method of controlling things in your body, such as your heartbeats, with your mind (biofeedback).  Yoga.  Meditation.  Physical activity such as swimming, jogging, or walking.  Get plenty of rest and sleep. SEEK MEDICAL CARE IF:   You continue to have a fast or irregular heartbeat beyond 24 hours.  Your palpitations occur more often. SEEK IMMEDIATE MEDICAL CARE IF:  You have chest pain or shortness of breath.  You have a severe headache.  You feel dizzy or you faint. MAKE SURE YOU:  Understand these instructions.  Will watch your condition.  Will get help right away if you are not doing well or get worse.   This information is not intended to replace advice given to you by your health care provider. Make sure you discuss any questions you have with your health care provider.   Document Released: 09/08/2000 Document Revised: 09/16/2013 Document  Reviewed: 11/10/2011 Elsevier Interactive Patient Education 2016 Elsevier Inc.  Premature Ventricular Contraction A premature ventricular contraction is an irregularity in the normal heart rhythm. These contractions are extra heartbeats  that occur too early in the normal sequence. In most cases, these contractions are harmless and do not require treatment. CAUSES Premature ventricular contractions may occur without a known cause. In healthy people, the extra contractions may be caused by:  Smoking.  Drinking alcohol.  Caffeine.  Certain medicines.  Some illegal drugs.  Stress. Sometimes, changes in chemicals in the blood (electrolytes) can also cause premature ventricular contractions. They can also occur in people with heart diseases that cause a decrease in blood flow to the heart. SIGNS AND SYMPTOMS Premature ventricular contractions often do not cause any symptoms. In some cases, you may have a feeling of your heart beating fast or skipping a beat (palpitations). DIAGNOSIS Your health care provider will take your medical history and do a physical exam. During the exam, the health care provider will check for irregular heartbeats. Various tests may be done to help diagnose premature ventricular contractions. These tests may include:  An ECG (electrocardiogram) to monitor the electrical activity of your heart.  Holter monitor testing. A Holter monitor is a portable device that can monitor the electrical activity of your heart over longer periods of time.  Stress tests to see how exercise affects your heart rhythm.  Echocardiogram. This test uses sound waves (ultrasound) to produce an image of your heart.  Electrophysiology study. This is used to evaluate the electrical conduction system of your heart. TREATMENT Usually, no treatment is needed. You may be advised to avoid things that can trigger the premature contractions, such as caffeine or alcohol. Medicines are sometimes given if symptoms are severe or if the extra heartbeats are very frequent. Treatment may also be needed for an underlying cause of the contractions if one is found. HOME CARE INSTRUCTIONS  Take medicines only as directed by your health care  provider.  Make any lifestyle changes recommended by your health care provider. These may include:  Quitting smoking.  Avoiding or limiting caffeine or alcohol.  Exercising. Talk to your health care provider about what type of exercise is safe for you.  Trying to reduce stress.  Keep all follow-up visits with your health care provider. This is important. SEEK IMMEDIATE MEDICAL CARE IF:  You feel palpitations that are frequent or continual.  You have chest pain.  You have shortness of breath.  You have sweating for no reason.  You have nausea and vomiting.  You become light-headed or faint.   This information is not intended to replace advice given to you by your health care provider. Make sure you discuss any questions you have with your health care provider.   Document Released: 04/28/2004 Document Revised: 10/02/2014 Document Reviewed: 02/12/2014 Elsevier Interactive Patient Education 2016 [employer]    Hypokalemia Hypokalemia means that the amount of potassium in the blood is lower than normal.Potassium is a chemical, called an electrolyte, that helps regulate the amount of fluid in the body. It also stimulates muscle contraction and helps nerves function properly.Most of the body's potassium is inside of cells, and only a very small amount is in the blood. Because the amount in the blood is so small, minor changes can be life-threatening. CAUSES  Antibiotics.  Diarrhea or vomiting.  Using laxatives too much, which can cause diarrhea.  Chronic kidney disease.  Water pills (diuretics).  Eating disorders (bulimia).  Low magnesium  level.  Sweating a lot. SIGNS AND SYMPTOMS  Weakness.  Constipation.  Fatigue.  Muscle cramps.  Mental confusion.  Skipped heartbeats or irregular heartbeat (palpitations).  Tingling or numbness. DIAGNOSIS  Your health care provider can diagnose hypokalemia with blood tests. In addition to checking your potassium  level, your health care provider may also check other lab tests. TREATMENT Hypokalemia can be treated with potassium supplements taken by mouth or adjustments in your current medicines. If your potassium level is very low, you may need to get potassium through a vein (IV) and be monitored in the hospital. A diet high in potassium is also helpful. Foods high in potassium are:  Nuts, such as peanuts and pistachios.  Seeds, such as sunflower seeds and pumpkin seeds.  Peas, lentils, and lima beans.  Whole grain and bran cereals and breads.  Fresh fruit and vegetables, such as apricots, avocado, bananas, cantaloupe, kiwi, oranges, tomatoes, asparagus, and potatoes.  Orange and tomato juices.  Red meats.  Fruit yogurt. HOME CARE INSTRUCTIONS  Take all medicines as prescribed by your health care provider.  Maintain a healthy diet by including nutritious food, such as fruits, vegetables, nuts, whole grains, and lean meats.  If you are taking a laxative, be sure to follow the directions on the label. SEEK MEDICAL CARE IF:  Your weakness gets worse.  You feel your heart pounding or racing.  You are vomiting or having diarrhea.  You are diabetic and having trouble keeping your blood glucose in the normal range. SEEK IMMEDIATE MEDICAL CARE IF:  You have chest pain, shortness of breath, or dizziness.  You are vomiting or having diarrhea for more than 2 days.  You faint. MAKE SURE YOU:   Understand these instructions.  Will watch your condition.  Will get help right away if you are not doing well or get worse.   This information is not intended to replace advice given to you by your health care provider. Make sure you discuss any questions you have with your health care provider.   Document Released: 09/11/2005 Document Revised: 10/02/2014 Document Reviewed: 03/14/2013 Elsevier Interactive Patient Education 2016 [employer].     Emergency Department Resource Guide 1)  Find a Doctor and Pay Out of Pocket Although you won't have to find out who is covered by your insurance plan, it is a good idea to ask around and get recommendations. You will then need to call the office and see if the doctor you have chosen will accept you as a new patient and what types of options they offer for patients who are self-pay. Some doctors offer discounts or will set up payment plans for their patients who do not have insurance, but you will need to ask so you aren't surprised when you get to your appointment.  2) Contact Your Local Health Department Not all health departments have doctors that can see patients for sick visits, but many do, so it is worth a call to see if yours does. If you don't know where your local health department is, you can check in your phone book. The CDC also has a tool to help you locate your state's health department, and many state websites also have listings of all of their local health departments.  3) Find a Walk-in Clinic If your illness is not likely to be very severe or complicated, you may want to try a walk in clinic. These are popping up all over the country in pharmacies, drugstores, and shopping centers. They're  usually staffed by nurse practitioners or physician assistants that have been trained to treat common illnesses and complaints. They're usually fairly quick and inexpensive. However, if you have serious medical issues or chronic medical problems, these are probably not your best option.  No Primary Care Doctor: - Call Health Connect at  [phone] - they can help you locate a primary care doctor that  accepts your insurance, provides certain services, etc. - Physician Referral Service- [phone]  Chronic Pain Problems: Organization         Address  Phone   Notes  [facility]  [phone] Patients need to be referred by their primary care doctor.   Medication Assistance: Organization         Address  Phone    Notes  [facility] Medication [facility] [address] [place], [place] [address] [phone] --Must be a resident of [facility] -- Must have NO insurance coverage whatsoever (no Medicaid/ Medicare, etc.) -- The pt. MUST have a primary care doctor that directs their care regularly and follows them in the community   [facility]  [phone]   [employer]  [phone]    Agencies that provide inexpensive medical care: Organization         Address  Phone   Notes  [facility]  [phone]   [facility]    [phone]   [facility] [address] [place], [place] [address] [phone]   [facility] [address] [place], [place] [phone]   Planned Parenthood    [phone]   [facility]    [phone]   Community [facility] and [facility]  [address], [place] Phone:  [phone], Fax:  [phone] Hours of Operation:  9 am - 6 pm, M-F.  Also accepts Medicaid/Medicare and self-pay.  [facility]  [address], [place] Phone: [phone], Fax: [phone]. Hours of Operation:  8:30 am - 5:30 pm, M-F.  Also accepts Medicaid and self-pay.  [facility] [place] [address] [place] Phone: [phone]   [facility] [address] Natasha Bence [place], [place] [phone], Ext. [phone] Mondays & Thursdays: 7-9 AM.  First 15 patients are seen on a first come, first serve basis.    Medicaid-accepting [facility] Providers:  Organization         Address  Phone   Notes  [facility] [address], [place] [phone] Also accepts self-pay patients.  [facility] [address] Laurell Josephs [place], [place]  [phone]   [facility] [address], [place] [phone]   [facility] [address] [place] [phone]   Renaye Rakers [address], [place]   [phone] Only accepts [place] patients after they have their name applied to their card.   Self-Pay (no insurance) in [facility]:  Organization         Address  Phone   Notes  Sickle Cell Patients, [facility] [address] [place], [place] [phone]   [facility] [address] [place], [place] [phone]   [facility] [place]  [address], [place] [phone]   [facility]/Dr. Osei-Bonsu  [address] [place] or [address], [place] [phone] Phone number for both [place] and [place] locations is the same.  [facility] [address], [place] [phone]   [facility] [address] [place] or [address] [phone]   [facility] [address] [place] [phone], phone; [phone], fax Sees patients 1st and 3rd Saturday of every month.  Must not qualify for public or private insurance (i.e. Medicaid, Medicare, [place] Health Choice, Veterans' Benefits)  Household income should be no more than 200% of the poverty level The clinic cannot treat you if you are pregnant or think you are pregnant  Sexually transmitted diseases are not treated at the clinic.    Dental Care: Organization         Address  Phone  Notes  [facility] Department of [facility] [address] [place], [place] [phone] Accepts children up to age 36 who are enrolled in [place] or [place] Health Choice; pregnant women with a Medicaid card; and children who have applied for Medicaid or [place] Health Choice, but were declined, whose parents can pay a reduced fee at time of service.  [facility] Department of [facility]  [address], [place] [phone] Accepts children up to age 47 who are enrolled in [place] or [place] Health Choice; pregnant women with a Medicaid card; and children who have applied for Medicaid or [place] Health Choice, but were declined, whose parents can pay a reduced fee at time of service.  [facility]  [address] [place], [place] [phone] Patients are seen by appointment only. Walk-ins are not accepted. [facility] will see patients 34 years of age and older. Monday - Tuesday (8am-5pm) Most Wednesdays (8:30-5pm) $30 per visit, cash only  [facility] Adult Dental Access PROGRAM  [address], [facility] [phone] Patients are seen by appointment only. Walk-ins are not accepted. [facility] will see patients 67 years of age and older. One Wednesday Evening (Monthly: Volunteer Based).  $30 per visit, cash only  [employer]  [phone] for adults; Children under age 4, call Graduate Pediatric Dentistry at [phone]. Children aged 62-14, please call [phone] to request a pediatric application.  Dental services are provided in all areas of dental care including fillings, crowns and bridges, complete and partial dentures, implants, gum treatment, root canals, and extractions. Preventive care is also provided. Treatment is provided to both adults and children. Patients are selected via a lottery and there is often a waiting list.   [facility] [address], [place]  [phone] [email or web address]   [facility] [address] [place], [place] [phone], Ext. [phone] Second and Fourth Thursday of each month, opens at 6:30 AM; Clinic ends at 9 AM.  Patients are seen on a first-come first-served basis, and a limited number are seen during each clinic.   [facility]  [address] Ether Griffins [place], [place] [phone]   Eligibility Requirements You must have lived in  [place], [place], or [place] counties for at least the last three months.   You cannot be eligible for state or federal sponsored [place], including [employer], [place], or [employer].   You generally cannot be eligible for healthcare insurance through  your employer.    How to apply: Eligibility screenings are held every Tuesday and Wednesday afternoon from 1:00 pm until 4:00 pm. You do not need an appointment for the interview!  [facility] [address], [place], [place] [phone]   [facility]  [phone]   [facility]  [phone]   [facility]  [phone]    Behavioral Health Resources in the Community: Intensive Outpatient Programs Organization         Address  Phone  Notes  [facility] [address], [place], [place] [phone]   [facility] [address], [place], [place] [phone]   [facility] [address], [place], [place]  [phone]   [facility] [address],  [place], [place] [phone] or [phone]   Substance Abuse Resources Organization         Address  Phone  Notes  [facility]  [phone]   [facility]  [phone]   [facility]  [phone]   [facility]  [phone]   Residential & Outpatient Substance Abuse Program  [phone]   Psychological Services Organization         Address  Phone  Notes  [facility]  [phone]- [phone]   [facility]  [phone]- [phone]   [facility] [address], [place] [phone] or [phone]    Mobile Crisis Teams Organization         Address  Phone  Notes  [facility], Mobile Crisis Care Unit  [phone]   [facility]  [address] [place], [place] [phone]   Doristine LocksSharon DeEsch [address] [place] [phone]    Self-Help/Support Groups Organization         Address  Phone             Notes  [facility] [place] - variety of support groups  [phone] Call for more information  Narcotics Anonymous (NA), [facility] [address], [place]  2 meetings at this location   Statisticianesidential Treatment Programs Organization         Address  Phone  Notes  [facility] [address],    [place]  [phone]   [facility]  [address], [place] [phone], [place], [place] [phone]   [facility] [address], [place] [phone] Admissions: 8am-3pm M-F  [facility] [address]    [place], [place] [phone]   [facility] [address], [place], [place] [phone]   [facility] [address]  [place], [place] [phone]   [facility] - Intensive Outpatient [address], [place], [place] [phone]   [facility] ([facility] [address]  [place], [place] [phone] or [phone]   [facility] ([facility]) [address] [place], [place] [phone] Accepts Medicaid  [facility] [address]  [place] [phone] Substance Abuse/Addiction Treatment   [facility] Resources Organization         Address  Phone  Notes  [facility]  [phone]   Angie FavaJulie Brannon, PhD [address] [place], [place]   [phone] or [phone]   [facility]   [address] [place], [place] [phone]  [facility] [address] [place], [place] [phone] Insurance/Medicaid/sponsorship through [employer] and Families [address]                                    [place], [place] [phone] Therapy/tele-psych/case  [facility] [address]   [place], [place] [phone]    Dr. Lolly Mustache  [phone]   [facility]  [employer] [facility] Dept. 1) [address] [place] 2) [address] [place] 3)  [address], [place] [phone]  [phone]   [facility] Child Abuse Hotline 603-328-8672 or [phone] (After Hours)

## 2015-07-12 ENCOUNTER — Encounter (HOSPITAL_COMMUNITY): Payer: Self-pay | Admitting: Oncology

## 2015-07-12 ENCOUNTER — Other Ambulatory Visit: Payer: Self-pay

## 2015-07-12 ENCOUNTER — Emergency Department (HOSPITAL_COMMUNITY)
Admission: EM | Admit: 2015-07-12 | Discharge: 2015-07-12 | Disposition: A | Discharge status: Home / Self Care | Payer: Medicaid Other | Attending: Emergency Medicine | Admitting: Emergency Medicine

## 2015-07-12 DIAGNOSIS — Z8739 Personal history of other diseases of the musculoskeletal system and connective tissue: Secondary | ICD-10-CM | POA: Diagnosis not present

## 2015-07-12 DIAGNOSIS — Z87828 Personal history of other (healed) physical injury and trauma: Secondary | ICD-10-CM | POA: Diagnosis not present

## 2015-07-12 DIAGNOSIS — F1210 Cannabis abuse, uncomplicated: Secondary | ICD-10-CM | POA: Diagnosis not present

## 2015-07-12 DIAGNOSIS — Z8679 Personal history of other diseases of the circulatory system: Secondary | ICD-10-CM | POA: Diagnosis not present

## 2015-07-12 DIAGNOSIS — Z720 Tobacco use: Secondary | ICD-10-CM | POA: Diagnosis not present

## 2015-07-12 DIAGNOSIS — R079 Chest pain, unspecified: Secondary | ICD-10-CM | POA: Diagnosis present

## 2015-07-12 DIAGNOSIS — Z880 Allergy status to penicillin: Secondary | ICD-10-CM | POA: Diagnosis not present

## 2015-07-12 LAB — I-STAT TROPONIN, ED: TROPONIN I, POC: 0.01 ng/mL (ref 0.00–0.08)

## 2015-07-12 NOTE — Discharge Instructions (Signed)
Cannabis Use Disorder Cannabis use disorder is a mental disorder. It is not one-time or occasional use of cannabis, more commonly known as marijuana. Cannabis use disorder is the continued, nonmedical use of cannabis that interferes with normal life activities or causes health problems. People with cannabis use disorder get a feeling of extreme pleasure and relaxation from cannabis use. This "high" is very rewarding and causes people to use over and over.  The mind-altering ingredient in cannabis is know as THC. THC can also interfere with motor coordination, memory, judgment, and accurate sense of space and time. These effects can last for a few days after using cannabis. Regular heavy cannabis use can cause long-lasting problems with thinking and learning. In young people, these problems may be permanent. Cannabis sometimes causes severe anxiety, paranoia, or visual hallucinations. Man-made (synthetic) cannabis-like drugs, such as "spice" and "K2," cause the same effects as THC but are much stronger. Cannabis-like drugs can cause dangerously high blood pressure and heart rate.  Cannabis use disorder usually starts in the teenage years. It can trigger the development of schizophrenia. It is somewhat more common in men than women. People who have family members with the disorder or existing mental health issues such as depression and posttraumatic stress disorderare more likely to develop cannabis use disorder. People with cannabis use disorder are at higher risk for use of other drugs of abuse.  SIGNS AND SYMPTOMS Signs and symptoms of cannabis use disorder include:   Use of cannabis in larger amounts or over a longer period than intended.   Unsuccessful attempts to cut down or control cannabis use.   A lot of time spent obtaining, using, or recovering from the effects of cannabis.   A strong desire or urge to use cannabis (cravings).   Continued use of cannabis in spite of problems at work,  school, or home because of use.   Continued use of cannabis in spite of relationship problems because of use.  Giving up or cutting down on important life activities because of cannabis use.  Use of cannabis over and over even in situations when it is physically hazardous, such as when driving a car.   Continued use of cannabis in spite of a physical problem that is likely related to use. Physical problems can include:  Chronic cough.  Bronchitis.  Emphysema.  Throat and lung cancer.  Continued use of cannabis in spite of a mental problem that is likely related to use. Mental problems can include:  Psychosis.  Anxiety.  Difficulty sleeping.  Need to use more and more cannabis to get the same effect, or lessened effect over time with use of the same amount (tolerance).  Having withdrawal symptoms when cannabis use is stopped, or using cannabis to reduce or avoid withdrawal symptoms. Withdrawal symptoms include:  Irritability or anger.  Anxiety or restlessness.  Difficulty sleeping.  Loss of appetite or weight.  Aches and pains.  Shakiness.  Sweating.  Chills. DIAGNOSIS Cannabis use disorder is diagnosed by your health care provider. You may be asked questions about your cannabis use and how it affects your life. A physical exam may be done. A drug screen may be done. You may be referred to a mental health professional. The diagnosis of cannabis use disorder requires at least two symptoms within 12 months. The type of cannabis use disorder you have depends on the number of symptoms you have. The type may be:  Mild. Two or three signs and symptoms.   Moderate. Four or   five signs and symptoms.   Severe. Six or more signs and symptoms.  TREATMENT Treatment is usually provided by mental health professionals with training in substance use disorders. The following options are available:  Counseling or talk therapy. Talk therapy addresses the reasons you use  cannabis. It also addresses ways to keep you from using again. The goals of talk therapy include:  Identifying and avoiding triggers for use.  Learning how to handle cravings.  Replacing use with healthy activities.  Support groups. Support groups provide emotional support, advice, and guidance.  Medicine. Medicine is used to treat mental health issues that trigger cannabis use or that result from it. HOME CARE INSTRUCTIONS  Take medicines only as directed by your health care provider.  Check with your health care provider before starting any new medicines.  Keep all follow-up visits as directed by your health care provider. SEEK MEDICAL CARE IF:  You are not able to take your medicines as directed.  Your symptoms get worse. SEEK IMMEDIATE MEDICAL CARE IF: You have serious thoughts about hurting yourself or others. FOR MORE INFORMATION  National Institute on Drug Abuse: www.drugabuse.gov  Substance Abuse and Mental Health Services Administration: www.samhsa.gov   This information is not intended to replace advice given to you by your health care provider. Make sure you discuss any questions you have with your health care provider.   Document Released: 09/08/2000 Document Revised: 10/02/2014 Document Reviewed: 09/24/2013 Elsevier Interactive Patient Education 2016 Elsevier Inc.  
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
## 2015-07-12 NOTE — ED Notes (Signed)
Pt just d/c'd for c/o chest pain and is presenting right back for the same c/o.  Pt is in NAD.

## 2015-07-12 NOTE — ED Notes (Signed)
Pt sleeping in lobby 

## 2015-11-22 ENCOUNTER — Encounter (HOSPITAL_COMMUNITY): Payer: Self-pay | Admitting: *Deleted

## 2015-11-22 ENCOUNTER — Emergency Department (HOSPITAL_COMMUNITY)
Admission: EM | Admit: 2015-11-22 | Discharge: 2015-11-22 | Disposition: A | Discharge status: Home / Self Care | Payer: Medicaid Other | Attending: Emergency Medicine | Admitting: Emergency Medicine

## 2015-11-22 DIAGNOSIS — Z880 Allergy status to penicillin: Secondary | ICD-10-CM | POA: Insufficient documentation

## 2015-11-22 DIAGNOSIS — Z87828 Personal history of other (healed) physical injury and trauma: Secondary | ICD-10-CM | POA: Diagnosis not present

## 2015-11-22 DIAGNOSIS — F99 Mental disorder, not otherwise specified: Secondary | ICD-10-CM | POA: Insufficient documentation

## 2015-11-22 DIAGNOSIS — Z8739 Personal history of other diseases of the musculoskeletal system and connective tissue: Secondary | ICD-10-CM | POA: Diagnosis not present

## 2015-11-22 DIAGNOSIS — H5712 Ocular pain, left eye: Secondary | ICD-10-CM | POA: Diagnosis present

## 2015-11-22 DIAGNOSIS — F17200 Nicotine dependence, unspecified, uncomplicated: Secondary | ICD-10-CM | POA: Insufficient documentation

## 2015-11-22 DIAGNOSIS — H109 Unspecified conjunctivitis: Secondary | ICD-10-CM

## 2015-11-22 DIAGNOSIS — J3489 Other specified disorders of nose and nasal sinuses: Secondary | ICD-10-CM | POA: Insufficient documentation

## 2015-11-22 DIAGNOSIS — Z8679 Personal history of other diseases of the circulatory system: Secondary | ICD-10-CM | POA: Insufficient documentation

## 2015-11-22 MED ORDER — ERYTHROMYCIN 5 MG/GM OP OINT
1.0000 "application " | TOPICAL_OINTMENT | Freq: Once | OPHTHALMIC | Status: AC
  Administered 2015-11-22: 1 via OPHTHALMIC
  Filled 2015-11-22: qty 3.5

## 2015-11-22 NOTE — Discharge Instructions (Signed)
Bacterial Conjunctivitis °Bacterial conjunctivitis, commonly called pink eye, is an inflammation of the clear membrane that covers the white part of the eye (conjunctiva). The inflammation can also happen on the underside of the eyelids. The blood vessels in the conjunctiva become inflamed, causing the eye to become red or pink. Bacterial conjunctivitis may spread easily from one eye to another and from person to person (contagious).  °CAUSES  °Bacterial conjunctivitis is caused by bacteria. The bacteria may come from your own skin, your upper respiratory tract, or from someone else with bacterial conjunctivitis. °SYMPTOMS  °The normally white color of the eye or the underside of the eyelid is usually pink or red. The pink eye is usually associated with irritation, tearing, and some sensitivity to light. Bacterial conjunctivitis is often associated with a thick, yellowish discharge from the eye. The discharge may turn into a crust on the eyelids overnight, which causes your eyelids to stick together. If a discharge is present, there may also be some blurred vision in the affected eye. °DIAGNOSIS  °Bacterial conjunctivitis is diagnosed by your caregiver through an eye exam and the symptoms that you report. Your caregiver looks for changes in the surface tissues of your eyes, which may point to the specific type of conjunctivitis. A sample of any discharge may be collected on a cotton-tip swab if you have a severe case of conjunctivitis, if your cornea is affected, or if you keep getting repeat infections that do not respond to treatment. The sample will be sent to a lab to see if the inflammation is caused by a bacterial infection and to see if the infection will respond to antibiotic medicines. °TREATMENT  °1. Bacterial conjunctivitis is treated with antibiotics. Antibiotic eyedrops are most often used. However, antibiotic ointments are also available. Antibiotics pills are sometimes used. Artificial tears or eye  washes may ease discomfort. °HOME CARE INSTRUCTIONS  °1. To ease discomfort, apply a cool, clean washcloth to your eye for 10-20 minutes, 3-4 times a day. °2. Gently wipe away any drainage from your eye with a warm, wet washcloth or a cotton ball. °3. Wash your hands often with soap and water. Use paper towels to dry your hands. °4. Do not share towels or washcloths. This may spread the infection. °5. Change or wash your pillowcase every day. °6. You should not use eye makeup until the infection is gone. °7. Do not operate machinery or drive if your vision is blurred. °8. Stop using contact lenses. Ask your caregiver how to sterilize or replace your contacts before using them again. This depends on the type of contact lenses that you use. °9. When applying medicine to the infected eye, do not touch the edge of your eyelid with the eyedrop bottle or ointment tube. °SEEK IMMEDIATE MEDICAL CARE IF:  °· Your infection has not improved within 3 days after beginning treatment. °· You had yellow discharge from your eye and it returns. °· You have increased eye pain. °· Your eye redness is spreading. °· Your vision becomes blurred. °· You have a fever or persistent symptoms for more than 2-3 days. °· You have a fever and your symptoms suddenly get worse. °· You have facial pain, redness, or swelling. °MAKE SURE YOU:  °· Understand these instructions. °· Will watch your condition. °· Will get help right away if you are not doing well or get worse. °  °This information is not intended to replace advice given to you by your health care provider. Make sure you   discuss any questions you have with your health care provider. °  °Document Released: 09/11/2005 Document Revised: 10/02/2014 Document Reviewed: 02/12/2012 °Elsevier Interactive Patient Education ©2016 Elsevier Inc. ° °How to Use Eye Drops and Eye Ointments °HOW TO APPLY EYE DROPS °Follow these steps when applying eye drops: °2. Wash your hands. °3. Tilt your head  back. °4. Put a finger under your eye and use it to gently pull your lower lid downward. Keep that finger in place. °5. Using your other hand, hold the dropper between your thumb and index finger. °6. Position the dropper just over the edge of the lower lid. Hold it as close to your eye as you can without touching the dropper to your eye. °7. Steady your hand. One way to do this is to lean your index finger against your brow. °8. Look up. °9. Slowly and gently squeeze one drop of medicine into your eye. °10. Close your eye. °11. Place a finger between your lower eyelid and your nose. Press gently for 2 minutes. This increases the amount of time that the medicine is exposed to the eye. It also reduces side effects that can develop if the drop gets into the bloodstream through the nose. °HOW TO APPLY EYE OINTMENTS °Follow these steps when applying eye ointments: °10. Wash your hands. °11. Put a finger under your eye and use it to gently pull your lower lid downward. Keep that finger in place. °12. Using your other hand, place the tip of the tube between your thumb and index finger with the remaining fingers braced against your cheek or nose. °13. Hold the tube just over the edge of your lower lid without touching the tube to your lid or eyeball. °14. Look up. °15. Line the inner part of your lower lid with ointment. °16. Gently pull up on your upper lid and look down. This will force the ointment to spread over the surface of the eye. °17. Release the upper lid. °18. If you can, close your eyes for 1-2 minutes. °Do not rub your eyes. If you applied the ointment correctly, your vision will be blurry for a few minutes. This is normal. °ADDITIONAL INFORMATION °· Make sure to use the eye drops or ointment as told by your health care provider. °· If you have been told to use both eye drops and an eye ointment, apply the eye drops first, then wait 3-4 minutes before you apply the ointment. °· Try not to touch the tip of the  dropper or tube to your eye. A dropper or tube that has touched the eye can become contaminated. °  °This information is not intended to replace advice given to you by your health care provider. Make sure you discuss any questions you have with your health care provider. °  °Document Released: 12/18/2000 Document Revised: 01/26/2015 Document Reviewed: 09/07/2014 °Elsevier Interactive Patient Education ©2016 Elsevier Inc. ° °
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
## 2015-11-22 NOTE — Progress Notes (Signed)
[facility] consulted for medication assistance.  Patient listed as having Medicaid insurance.  EDCM is unable to assist patient with cost of medications as with Medicaid, medications are generally three dollars or less.  Patient to be discharged on erythromycin ointment, which is on the Medicaid preferred drug list and will cost patient three dollars.  No further EDCM needs at this time.

## 2015-11-22 NOTE — ED Notes (Signed)
Declined W/C at D/C and was escorted to lobby by RN. 

## 2017-06-04 ENCOUNTER — Ambulatory Visit (HOSPITAL_COMMUNITY)
Admission: RE | Admit: 2017-06-04 | Discharge: 2017-06-04 | Disposition: A | Discharge status: Home / Self Care | Payer: Medicaid Other | Source: Ambulatory Visit | Attending: Internal Medicine | Admitting: Internal Medicine

## 2017-06-04 ENCOUNTER — Other Ambulatory Visit (HOSPITAL_COMMUNITY): Payer: Self-pay | Admitting: Internal Medicine

## 2017-06-04 DIAGNOSIS — M25511 Pain in right shoulder: Secondary | ICD-10-CM | POA: Insufficient documentation

## 2017-10-17 ENCOUNTER — Ambulatory Visit (HOSPITAL_COMMUNITY)
Admission: EM | Admit: 2017-10-17 | Discharge: 2017-10-17 | Disposition: A | Discharge status: Home / Self Care | Payer: Medicaid Other | Attending: Family Medicine | Admitting: Family Medicine

## 2017-10-17 ENCOUNTER — Other Ambulatory Visit: Payer: Self-pay

## 2017-10-17 ENCOUNTER — Encounter (HOSPITAL_COMMUNITY): Payer: Self-pay | Admitting: Emergency Medicine

## 2017-10-17 DIAGNOSIS — F17210 Nicotine dependence, cigarettes, uncomplicated: Secondary | ICD-10-CM | POA: Insufficient documentation

## 2017-10-17 DIAGNOSIS — Z880 Allergy status to penicillin: Secondary | ICD-10-CM | POA: Insufficient documentation

## 2017-10-17 DIAGNOSIS — G40909 Epilepsy, unspecified, not intractable, without status epilepticus: Secondary | ICD-10-CM | POA: Diagnosis not present

## 2017-10-17 DIAGNOSIS — Z20828 Contact with and (suspected) exposure to other viral communicable diseases: Secondary | ICD-10-CM | POA: Insufficient documentation

## 2017-10-17 DIAGNOSIS — J029 Acute pharyngitis, unspecified: Secondary | ICD-10-CM

## 2017-10-17 LAB — POCT RAPID STREP A: Streptococcus, Group A Screen (Direct): NEGATIVE

## 2017-10-17 NOTE — ED Triage Notes (Signed)
Pt is here today because the house he lives in has two children in it and one of them tested positive today for the "indoor flu" per the mother of the kids.  She states that they were both tested twice and the second test was being sent off for further testing.  The pt states he has had a mild sore throat for two days but no other symptoms.  He is here today to get Tamiflu because they were prescribed Tamiflu.
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
## 2017-10-20 LAB — CULTURE, GROUP A STREP (THRC)

## 2017-11-03 ENCOUNTER — Emergency Department (HOSPITAL_COMMUNITY): Payer: Medicaid Other

## 2017-11-03 ENCOUNTER — Emergency Department (HOSPITAL_COMMUNITY)
Admission: EM | Admit: 2017-11-03 | Discharge: 2017-11-04 | Disposition: A | Discharge status: Home / Self Care | Payer: Medicaid Other | Attending: Emergency Medicine | Admitting: Emergency Medicine

## 2017-11-03 ENCOUNTER — Encounter (HOSPITAL_COMMUNITY): Payer: Self-pay | Admitting: Emergency Medicine

## 2017-11-03 DIAGNOSIS — F17210 Nicotine dependence, cigarettes, uncomplicated: Secondary | ICD-10-CM | POA: Diagnosis not present

## 2017-11-03 DIAGNOSIS — F12922 Cannabis use, unspecified with intoxication with perceptual disturbance: Secondary | ICD-10-CM

## 2017-11-03 DIAGNOSIS — R079 Chest pain, unspecified: Secondary | ICD-10-CM | POA: Diagnosis present

## 2017-11-03 DIAGNOSIS — F12122 Cannabis abuse with intoxication with perceptual disturbance: Secondary | ICD-10-CM | POA: Diagnosis not present

## 2017-11-03 DIAGNOSIS — R0789 Other chest pain: Secondary | ICD-10-CM | POA: Insufficient documentation

## 2017-11-03 LAB — COMPREHENSIVE METABOLIC PANEL
ALBUMIN: 3.9 g/dL (ref 3.5–5.0)
ALK PHOS: 59 U/L (ref 38–126)
ALT: 12 U/L — AB (ref 17–63)
AST: 21 U/L (ref 15–41)
Anion gap: 14 (ref 5–15)
BUN: 9 mg/dL (ref 6–20)
CALCIUM: 9.1 mg/dL (ref 8.9–10.3)
CO2: 22 mmol/L (ref 22–32)
CREATININE: 1.27 mg/dL — AB (ref 0.61–1.24)
Chloride: 103 mmol/L (ref 101–111)
GFR calc non Af Amer: >60 mL/min (ref >60)
GLUCOSE: 217 mg/dL — AB (ref 65–99)
Potassium: 3.1 mmol/L — ABNORMAL LOW (ref 3.5–5.1)
Sodium: 139 mmol/L (ref 135–145)
Total Bilirubin: 1 mg/dL (ref 0.3–1.2)
Total Protein: 6.8 g/dL (ref 6.5–8.1)

## 2017-11-03 LAB — CBC
HEMATOCRIT: 43.8 % (ref 39.0–52.0)
Hemoglobin: 14.4 g/dL (ref 13.0–17.0)
MCH: 30.3 pg (ref 26.0–34.0)
MCHC: 32.9 g/dL (ref 30.0–36.0)
MCV: 92.2 fL (ref 78.0–100.0)
Platelets: 310 10*3/uL (ref 150–400)
RBC: 4.75 MIL/uL (ref 4.22–5.81)
RDW: 12.9 % (ref 11.5–15.5)
WBC: 10.5 10*3/uL (ref 4.0–10.5)

## 2017-11-03 LAB — I-STAT TROPONIN, ED: Troponin i, poc: 0 ng/mL (ref 0.00–0.08)

## 2017-11-03 LAB — ETHANOL: Alcohol, Ethyl (B): <10 mg/dL (ref <10)

## 2017-11-03 MED ORDER — LORAZEPAM 2 MG/ML IJ SOLN
0.5000 mg | Freq: Once | INTRAMUSCULAR | Status: AC
  Administered 2017-11-03: 0.5 mg via INTRAVENOUS
  Filled 2017-11-03: qty 1

## 2017-11-03 MED ORDER — SODIUM CHLORIDE 0.9 % IV BOLUS (SEPSIS)
1000.0000 mL | Freq: Once | INTRAVENOUS | Status: AC
  Administered 2017-11-04: 1000 mL via INTRAVENOUS
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
## 2017-11-03 NOTE — ED Notes (Signed)
EDP at bedside  

## 2017-11-03 NOTE — ED Notes (Signed)
Patient transported to x-ray. ?

## 2017-11-03 NOTE — ED Triage Notes (Signed)
Per EMS:  Pt presents to ED for assessment after smoking marijuana 30-45 minutes ago.  States he is having pain centrally in his chest "in my heart".  Patent keeps repeating "[facility]  Some nausea, no emesis, denies SOB.  Patient anxious in room, shaking.  Given 324 ASA and 0.4 of nitro en route.

## 2017-11-04 LAB — I-STAT TROPONIN, ED: Troponin i, poc: 0 ng/mL (ref 0.00–0.08)

## 2017-11-04 MED ORDER — POTASSIUM CHLORIDE CRYS ER 20 MEQ PO TBCR
40.0000 meq | EXTENDED_RELEASE_TABLET | Freq: Once | ORAL | Status: AC
  Administered 2017-11-04: 40 meq via ORAL
  Filled 2017-11-04: qty 2

## 2017-11-04 NOTE — Discharge Instructions (Signed)
You had a reaction to marijuana today.  Do not use any more marijuana.

## 2017-11-04 NOTE — ED Notes (Signed)
Pt refused to get his IV taken out. RN notified.

## 2018-01-20 ENCOUNTER — Other Ambulatory Visit: Payer: Self-pay

## 2018-01-20 ENCOUNTER — Emergency Department (HOSPITAL_COMMUNITY)
Admission: EM | Admit: 2018-01-20 | Discharge: 2018-01-22 | Disposition: A | Discharge status: Other Acute Inpatient Hospital | Payer: Medicaid Other | Attending: Emergency Medicine | Admitting: Emergency Medicine

## 2018-01-20 ENCOUNTER — Emergency Department (HOSPITAL_COMMUNITY): Payer: Medicaid Other

## 2018-01-20 ENCOUNTER — Encounter (HOSPITAL_COMMUNITY): Payer: Self-pay | Admitting: *Deleted

## 2018-01-20 DIAGNOSIS — F1290 Cannabis use, unspecified, uncomplicated: Secondary | ICD-10-CM | POA: Diagnosis not present

## 2018-01-20 DIAGNOSIS — F17210 Nicotine dependence, cigarettes, uncomplicated: Secondary | ICD-10-CM | POA: Diagnosis not present

## 2018-01-20 DIAGNOSIS — F1210 Cannabis abuse, uncomplicated: Secondary | ICD-10-CM

## 2018-01-20 DIAGNOSIS — F259 Schizoaffective disorder, unspecified: Secondary | ICD-10-CM | POA: Diagnosis not present

## 2018-01-20 DIAGNOSIS — R079 Chest pain, unspecified: Secondary | ICD-10-CM | POA: Diagnosis present

## 2018-01-20 DIAGNOSIS — R0789 Other chest pain: Secondary | ICD-10-CM

## 2018-01-20 LAB — BASIC METABOLIC PANEL
Anion gap: 8 (ref 5–15)
BUN: 9 mg/dL (ref 6–20)
CHLORIDE: 106 mmol/L (ref 101–111)
CO2: 27 mmol/L (ref 22–32)
Calcium: 9.1 mg/dL (ref 8.9–10.3)
Creatinine, Ser: 1.1 mg/dL (ref 0.61–1.24)
GFR calc Af Amer: >60 mL/min (ref >60)
GFR calc non Af Amer: >60 mL/min (ref >60)
GLUCOSE: 93 mg/dL (ref 65–99)
POTASSIUM: 3.7 mmol/L (ref 3.5–5.1)
Sodium: 141 mmol/L (ref 135–145)

## 2018-01-20 LAB — RAPID URINE DRUG SCREEN, HOSP PERFORMED
AMPHETAMINES: NOT DETECTED
Barbiturates: NOT DETECTED
Benzodiazepines: NOT DETECTED
Cocaine: NOT DETECTED
OPIATES: NOT DETECTED
Tetrahydrocannabinol: POSITIVE — AB

## 2018-01-20 LAB — URINALYSIS, ROUTINE W REFLEX MICROSCOPIC
BACTERIA UA: NONE SEEN
Bilirubin Urine: NEGATIVE
Glucose, UA: NEGATIVE mg/dL
Ketones, ur: NEGATIVE mg/dL
Leukocytes, UA: NEGATIVE
Nitrite: NEGATIVE
PROTEIN: NEGATIVE mg/dL
Specific Gravity, Urine: 1.009 (ref 1.005–1.030)
pH: 7 (ref 5.0–8.0)

## 2018-01-20 LAB — CBC
HCT: 40.5 % (ref 39.0–52.0)
HEMOGLOBIN: 13.1 g/dL (ref 13.0–17.0)
MCH: 29.8 pg (ref 26.0–34.0)
MCHC: 32.3 g/dL (ref 30.0–36.0)
MCV: 92.3 fL (ref 78.0–100.0)
Platelets: 265 10*3/uL (ref 150–400)
RBC: 4.39 MIL/uL (ref 4.22–5.81)
RDW: 13.3 % (ref 11.5–15.5)
WBC: 5.2 10*3/uL (ref 4.0–10.5)

## 2018-01-20 LAB — ACETAMINOPHEN LEVEL

## 2018-01-20 LAB — SALICYLATE LEVEL: Salicylate Lvl: <7 mg/dL (ref 2.8–30.0)

## 2018-01-20 LAB — I-STAT TROPONIN, ED: Troponin i, poc: 0.01 ng/mL (ref 0.00–0.08)

## 2018-01-20 LAB — ETHANOL: Alcohol, Ethyl (B): <10 mg/dL (ref <10)

## 2018-01-20 NOTE — ED Triage Notes (Signed)
PT sleeping thur out triage

## 2018-01-20 NOTE — ED Triage Notes (Signed)
EMS called for CP . Per EMS Pt reported he gets CP after drug use.
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
## 2018-01-21 ENCOUNTER — Inpatient Hospital Stay (HOSPITAL_COMMUNITY): Admission: AD | Admit: 2018-01-21 | Payer: Medicaid Other | Source: Intra-hospital | Admitting: Psychiatry

## 2018-01-21 NOTE — Progress Notes (Addendum)
Pt accepted to [facility].  Dr. Benson Norway is the attending/accepting provider.   Call report to [phone].  [facility] Beecham [facility] ED notified. Due to pt's current mental state (delusional, tangential thought process, disorganized speech), CSW has requested that pt be petitioned for IVC before being transported to inpatient treatment. French Ana in admissions at [facility] also is requesting this. ED RN will discuss this with EDP.  If IVC is deemed appropriate, pt can be transported by law enforcement and may arrive at [facility] on 01/22/18 after 9am.   Wells Guiles, LCSW, LCAS Disposition CSW [facility]/TTS [phone]  CSW inform admissions staff at [facility] that an IVC is being petitioned. [facility] ED staff can fax IVC paperwork directly to TS admissions at [phone].

## 2018-01-21 NOTE — ED Notes (Signed)
Breakfast tray ordered 

## 2018-01-21 NOTE — ED Notes (Signed)
Dinner tray ordered.

## 2018-01-21 NOTE — ED Notes (Signed)
Sandwich bag and drink provided to patient. Pt cooperative and calm. States "God told me I'm going to die tonight" "I know its going to be here at [facility]". Pt denies SI/HI at this time. TTS has been completed. Plan is to observe pt overnight and reevaluate pt in the morning. States "I smoked some weed and started having chest pain" "I don't hurt now, but god told me I'm going to die tonight". Will continue to monitor.

## 2018-01-21 NOTE — Progress Notes (Signed)
TTS consulted with Nira Conn, NP who recommends continued observation and to be reassessed in the AM by psych. EDP Caccavale, Sophia, PA-C notified and states she will inform the RN of the disposition.  Princess Bruins, MSW, LCSW Therapeutic Triage Specialist  [phone]

## 2018-01-21 NOTE — ED Notes (Signed)
Pt asked to walk to the bathroom instead of using urinal;  Pt states he needs urinal due to UTI; pt has brief on; No UTI documented-Monique,RN

## 2018-01-21 NOTE — ED Notes (Signed)
Patient denies pain and is resting comfortably.  

## 2018-01-21 NOTE — ED Notes (Signed)
Spoke with [facility], they are currently seeking placement for inpatient treatment.

## 2018-01-21 NOTE — ED Notes (Signed)
Pt placed in scrubs, wanded and belongings removed. Valuables w/ security.

## 2018-01-21 NOTE — ED Notes (Signed)
Pt resting, with eyes closed

## 2018-01-22 ENCOUNTER — Other Ambulatory Visit: Payer: Self-pay

## 2018-01-22 NOTE — ED Notes (Signed)
Spoke with Dr.James who is aware that patient needs to have IVC papers completed for pt to be transferred on 01/22/18; Paperwork has been provided to Dr.James for completion-Monique,RN

## 2018-01-22 NOTE — ED Notes (Signed)
IVC papers served. 

## 2018-01-22 NOTE — Progress Notes (Signed)
Disposition CSW called [place] Civil Magistrate's office, [phone] to inquire as to status of IVC.  Magistrate states that they have been sent out to be served.  Johntae Broxterman T. Saahir Prude, MSW, LCSWA Disposition Clinical Social Work [phone] (cell) [phone] (office)   

## 2018-01-22 NOTE — ED Notes (Addendum)
Pt aware and voiced agreement w/tx plan. Accepted to [facility] - pt called family member/friend to advise.

## 2018-03-27 ENCOUNTER — Encounter (HOSPITAL_COMMUNITY): Payer: Self-pay | Admitting: Emergency Medicine

## 2018-03-27 ENCOUNTER — Emergency Department (HOSPITAL_COMMUNITY)
Admission: EM | Admit: 2018-03-27 | Discharge: 2018-03-27 | Disposition: A | Discharge status: Home / Self Care | Payer: Medicaid Other | Source: Home / Self Care | Attending: Emergency Medicine | Admitting: Emergency Medicine

## 2018-03-27 ENCOUNTER — Emergency Department (HOSPITAL_COMMUNITY)
Admission: EM | Admit: 2018-03-27 | Discharge: 2018-03-27 | Disposition: A | Discharge status: Home / Self Care | Payer: Medicaid Other | Attending: Emergency Medicine | Admitting: Emergency Medicine

## 2018-03-27 ENCOUNTER — Other Ambulatory Visit: Payer: Self-pay

## 2018-03-27 ENCOUNTER — Emergency Department (HOSPITAL_COMMUNITY): Payer: Medicaid Other

## 2018-03-27 DIAGNOSIS — W260XXA Contact with knife, initial encounter: Secondary | ICD-10-CM | POA: Diagnosis not present

## 2018-03-27 DIAGNOSIS — F17200 Nicotine dependence, unspecified, uncomplicated: Secondary | ICD-10-CM | POA: Insufficient documentation

## 2018-03-27 DIAGNOSIS — Y999 Unspecified external cause status: Secondary | ICD-10-CM | POA: Diagnosis not present

## 2018-03-27 DIAGNOSIS — Y929 Unspecified place or not applicable: Secondary | ICD-10-CM | POA: Diagnosis not present

## 2018-03-27 DIAGNOSIS — Y9389 Activity, other specified: Secondary | ICD-10-CM | POA: Insufficient documentation

## 2018-03-27 DIAGNOSIS — S6992XA Unspecified injury of left wrist, hand and finger(s), initial encounter: Secondary | ICD-10-CM | POA: Diagnosis present

## 2018-03-27 DIAGNOSIS — S0181XA Laceration without foreign body of other part of head, initial encounter: Secondary | ICD-10-CM

## 2018-03-27 DIAGNOSIS — S0240FA Zygomatic fracture, left side, initial encounter for closed fracture: Secondary | ICD-10-CM

## 2018-03-27 DIAGNOSIS — S61211A Laceration without foreign body of left index finger without damage to nail, initial encounter: Secondary | ICD-10-CM | POA: Diagnosis not present

## 2018-03-27 MED ORDER — LIDOCAINE-EPINEPHRINE-TETRACAINE (LET) SOLUTION
3.0000 mL | Freq: Once | NASAL | Status: AC
  Administered 2018-03-27: 19:00:00 3 mL via TOPICAL
  Filled 2018-03-27: qty 3

## 2018-03-27 MED ORDER — LIDOCAINE-EPINEPHRINE (PF) 2 %-1:200000 IJ SOLN
10.0000 mL | Freq: Once | INTRAMUSCULAR | Status: AC
  Administered 2018-03-27: 10 mL
  Filled 2018-03-27: qty 20

## 2018-03-27 MED ORDER — TETANUS-DIPHTH-ACELL PERTUSSIS 5-2.5-18.5 LF-MCG/0.5 IM SUSP
0.5000 mL | Freq: Once | INTRAMUSCULAR | Status: AC
  Administered 2018-03-27: 0.5 mL via INTRAMUSCULAR
  Filled 2018-03-27: qty 0.5

## 2018-03-27 NOTE — ED Provider Notes (Signed)
..  Laceration Repair Date/Time: 03/27/2018 9:59 PM Performed by: Dionne Anoorrell, Deborah N, MD Authorized by: Laurence SpatesLittle, Rachel Morgan, MD   Consent:    Consent obtained:  Verbal   Consent given by:  Patient Anesthesia (see MAR for exact dosages):    Anesthesia method:  Topical application and local infiltration   Topical anesthetic:  LET   Local anesthetic:  Lidocaine 2% WITH epi Laceration details:    Location:  Face   Face location:  L cheek   Length (cm):  2 Repair type:    Repair type:  Intermediate Pre-procedure details:    Preparation:  Patient was prepped and draped in usual sterile fashion Exploration:    Hemostasis achieved with:  Epinephrine and LET   Wound exploration: entire depth of wound probed and visualized   Treatment:    Area cleansed with:  Betadine and saline   Amount of cleaning:  Standard   Irrigation solution:  Sterile water   Irrigation method:  Pressure wash Skin repair:    Repair method:  Sutures   Suture size:  6-0   Suture material:  Nylon   Suture technique:  Simple interrupted   Number of sutures:  6 Approximation:    Approximation:  Close Post-procedure details:    Dressing:  Antibiotic ointment and adhesive bandage   Patient tolerance of procedure:  Tolerated well, no immediate complications      Dorrell, Cathleen Cortieborah N, MD 03/27/18 2201    Clarene DukeLittle, Ambrose Finlandachel Morgan, MD 03/29/18 [phone]

## 2018-03-27 NOTE — Discharge Instructions (Signed)
EAT A SOFT DIET FOR THE NEXT SEVERAL DAYS. APPLY ICE TO YOUR FACE. SLEEP PROPPED UP ON PILLOWS. NO NOSE BLOWING FOR 2 WEEKS. KEEP WOUNDS CLEAN AND DRY. DO NOT SWIM. FOLLOW UP AT CLINIC OR HERE IN 1 WEEK FOR SUTURE REMOVAL. CALL ENT CLINIC TO FOLLOW UP ABOUT YOUR FACIAL FRACTURE. RETURN TO ER IF ANY FEVER, VOMITING, REDNESS OR DRAINAGE AT WOUNDS.
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
## 2018-03-27 NOTE — ED Triage Notes (Signed)
Pt reports he cut his L pointer finger on piece of wire. Small lac noted, bleeding controlled. Tetanus UTD.
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
## 2018-03-27 NOTE — ED Provider Notes (Signed)
..  Laceration Repair Date/Time: 03/27/2018 9:55 PM Performed by: Dionne Anoorrell, Deborah N, MD Authorized by: Laurence SpatesLittle, Rachel Morgan, MD   Consent:    Consent obtained:  Verbal   Consent given by:  Patient Anesthesia (see MAR for exact dosages):    Anesthesia method:  Topical application and local infiltration   Topical anesthetic:  LET   Local anesthetic:  Lidocaine 2% WITH epi Laceration details:    Location:  Face   Face location:  L eyebrow   Length (cm):  3 Repair type:    Repair type:  Intermediate Pre-procedure details:    Preparation:  Patient was prepped and draped in usual sterile fashion and imaging obtained to evaluate for foreign bodies Exploration:    Hemostasis achieved with:  Epinephrine and LET   Wound exploration: entire depth of wound probed and visualized   Treatment:    Area cleansed with:  Betadine and saline   Amount of cleaning:  Standard   Irrigation solution:  Sterile water   Irrigation method:  Pressure wash Skin repair:    Repair method:  Sutures   Suture size:  6-0   Suture material:  Nylon   Suture technique:  Simple interrupted   Number of sutures:  8 Approximation:    Approximation:  Close Post-procedure details:    Dressing:  Antibiotic ointment and adhesive bandage   Patient tolerance of procedure:  Tolerated well, no immediate complications      Dorrell, Cathleen Cortieborah N, MD 03/27/18 2159    Clarene DukeLittle, Ambrose Finlandachel Morgan, MD 03/28/18 [phone]
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
## 2018-03-27 NOTE — ED Notes (Signed)
Pt. Unable to sign. Pt. Did not have any further questions about discharge paperwork.

## 2018-04-03 ENCOUNTER — Encounter (HOSPITAL_COMMUNITY): Payer: Self-pay

## 2018-04-03 ENCOUNTER — Emergency Department (HOSPITAL_COMMUNITY)
Admission: EM | Admit: 2018-04-03 | Discharge: 2018-04-03 | Disposition: A | Discharge status: Home / Self Care | Payer: Medicaid Other | Attending: Emergency Medicine | Admitting: Emergency Medicine

## 2018-04-03 ENCOUNTER — Other Ambulatory Visit: Payer: Self-pay

## 2018-04-03 DIAGNOSIS — F17200 Nicotine dependence, unspecified, uncomplicated: Secondary | ICD-10-CM | POA: Diagnosis not present

## 2018-04-03 DIAGNOSIS — Z4802 Encounter for removal of sutures: Secondary | ICD-10-CM | POA: Diagnosis present

## 2018-04-03 NOTE — ED Notes (Signed)
Pt verbalized understanding of d/c instructions, no further questions. VSS, NAD.

## 2018-04-03 NOTE — Discharge Instructions (Signed)
[facility]
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
## 2018-04-03 NOTE — ED Notes (Signed)
PA at bedside to remove stitches.

## 2018-04-03 NOTE — ED Triage Notes (Signed)
Pt here for stitch removal. No complaints. VSS

## 2018-05-18 ENCOUNTER — Encounter (HOSPITAL_COMMUNITY): Payer: Self-pay | Admitting: Emergency Medicine

## 2018-05-18 ENCOUNTER — Emergency Department (HOSPITAL_COMMUNITY)
Admission: EM | Admit: 2018-05-18 | Discharge: 2018-05-19 | Disposition: A | Discharge status: Home / Self Care | Payer: Medicaid Other | Attending: Emergency Medicine | Admitting: Emergency Medicine

## 2018-05-18 DIAGNOSIS — F17210 Nicotine dependence, cigarettes, uncomplicated: Secondary | ICD-10-CM | POA: Insufficient documentation

## 2018-05-18 DIAGNOSIS — F12280 Cannabis dependence with cannabis-induced anxiety disorder: Secondary | ICD-10-CM | POA: Diagnosis not present

## 2018-05-18 DIAGNOSIS — Z008 Encounter for other general examination: Secondary | ICD-10-CM | POA: Insufficient documentation

## 2018-05-18 DIAGNOSIS — R079 Chest pain, unspecified: Secondary | ICD-10-CM

## 2018-05-18 DIAGNOSIS — R072 Precordial pain: Secondary | ICD-10-CM | POA: Diagnosis not present

## 2018-05-18 LAB — CBC
HCT: 42.5 % (ref 39.0–52.0)
HEMOGLOBIN: 13.5 g/dL (ref 13.0–17.0)
MCH: 29.9 pg (ref 26.0–34.0)
MCHC: 31.8 g/dL (ref 30.0–36.0)
MCV: 94.2 fL (ref 78.0–100.0)
PLATELETS: 318 10*3/uL (ref 150–400)
RBC: 4.51 MIL/uL (ref 4.22–5.81)
RDW: 12.5 % (ref 11.5–15.5)
WBC: 8 10*3/uL (ref 4.0–10.5)

## 2018-05-18 LAB — BASIC METABOLIC PANEL
ANION GAP: 10 (ref 5–15)
BUN: 14 mg/dL (ref 6–20)
CALCIUM: 8.9 mg/dL (ref 8.9–10.3)
CO2: 28 mmol/L (ref 22–32)
CREATININE: 1.57 mg/dL — AB (ref 0.61–1.24)
Chloride: 106 mmol/L (ref 98–111)
GFR, EST AFRICAN AMERICAN: 59 mL/min — AB (ref >60)
GFR, EST NON AFRICAN AMERICAN: 51 mL/min — AB (ref >60)
Glucose, Bld: 127 mg/dL — ABNORMAL HIGH (ref 70–99)
Potassium: 3.1 mmol/L — ABNORMAL LOW (ref 3.5–5.1)
SODIUM: 144 mmol/L (ref 135–145)

## 2018-05-18 LAB — I-STAT TROPONIN, ED: TROPONIN I, POC: 0.01 ng/mL (ref 0.00–0.08)

## 2018-05-18 MED ORDER — ONDANSETRON HCL 4 MG/2ML IJ SOLN
4.0000 mg | Freq: Once | INTRAMUSCULAR | Status: AC
  Administered 2018-05-18: 4 mg via INTRAVENOUS
  Filled 2018-05-18: qty 2

## 2018-05-18 MED ORDER — LORAZEPAM 2 MG/ML IJ SOLN
0.5000 mg | Freq: Once | INTRAMUSCULAR | Status: AC
  Administered 2018-05-18: 0.5 mg via INTRAVENOUS
  Filled 2018-05-18: qty 1
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
## 2018-05-18 NOTE — ED Triage Notes (Signed)
  Patient was smoking marijuana at home earlier this evening and started having mid sternal chest pains.  Patient was given 324 ASA and 1 nitro with no relief.  Patient is also very paranoid stating that "hes gonna die today from a heart attack".  Patient is A&O x4.  Pain 10/10.

## 2018-05-19 ENCOUNTER — Other Ambulatory Visit: Payer: Self-pay

## 2018-05-19 ENCOUNTER — Emergency Department (HOSPITAL_COMMUNITY): Payer: Medicaid Other

## 2018-05-19 ENCOUNTER — Encounter (HOSPITAL_COMMUNITY): Payer: Self-pay | Admitting: Registered Nurse

## 2018-05-19 LAB — HEPATIC FUNCTION PANEL
ALT: 9 U/L (ref 0–44)
AST: 17 U/L (ref 15–41)
Albumin: 3.7 g/dL (ref 3.5–5.0)
Alkaline Phosphatase: 58 U/L (ref 38–126)
BILIRUBIN INDIRECT: 0.4 mg/dL (ref 0.3–0.9)
Bilirubin, Direct: 0.1 mg/dL (ref 0.0–0.2)
TOTAL PROTEIN: 6.3 g/dL — AB (ref 6.5–8.1)
Total Bilirubin: 0.5 mg/dL (ref 0.3–1.2)

## 2018-05-19 LAB — RAPID URINE DRUG SCREEN, HOSP PERFORMED
Amphetamines: NOT DETECTED
Barbiturates: NOT DETECTED
Benzodiazepines: NOT DETECTED
Cocaine: NOT DETECTED
Opiates: NOT DETECTED
Tetrahydrocannabinol: POSITIVE — AB

## 2018-05-19 LAB — D-DIMER, QUANTITATIVE: D-Dimer, Quant: 1.04 ug/mL-FEU — ABNORMAL HIGH (ref 0.00–0.50)

## 2018-05-19 LAB — I-STAT TROPONIN, ED: TROPONIN I, POC: 0.02 ng/mL (ref 0.00–0.08)

## 2018-05-19 LAB — LIPASE, BLOOD: Lipase: 28 U/L (ref 11–51)

## 2018-05-19 LAB — ETHANOL

## 2018-05-19 MED ORDER — ACETAMINOPHEN 325 MG PO TABS: 650.0000 mg | ORAL_TABLET | ORAL | Status: DC | PRN

## 2018-05-19 MED ORDER — LORAZEPAM 1 MG PO TABS: 0.0000 mg | ORAL_TABLET | Freq: Two times a day (BID) | ORAL | Status: DC

## 2018-05-19 MED ORDER — NICOTINE 21 MG/24HR TD PT24: 21.0000 mg | MEDICATED_PATCH | Freq: Every day | TRANSDERMAL | Status: DC

## 2018-05-19 MED ORDER — IOPAMIDOL (ISOVUE-370) INJECTION 76%
100.0000 mL | Freq: Once | INTRAVENOUS | Status: AC | PRN
  Administered 2018-05-19: 100 mL via INTRAVENOUS

## 2018-05-19 MED ORDER — ALUM & MAG HYDROXIDE-SIMETH 200-200-20 MG/5ML PO SUSP: 30.0000 mL | Freq: Four times a day (QID) | ORAL | Status: DC | PRN

## 2018-05-19 MED ORDER — VITAMIN B-1 100 MG PO TABS: 100.0000 mg | ORAL_TABLET | Freq: Every day | ORAL | Status: DC

## 2018-05-19 MED ORDER — LORAZEPAM 2 MG/ML IJ SOLN: 0.0000 mg | Freq: Four times a day (QID) | INTRAMUSCULAR | Status: DC

## 2018-05-19 MED ORDER — POTASSIUM CHLORIDE CRYS ER 20 MEQ PO TBCR
40.0000 meq | EXTENDED_RELEASE_TABLET | Freq: Once | ORAL | Status: AC
  Administered 2018-05-19: 40 meq via ORAL
  Filled 2018-05-19: qty 2

## 2018-05-19 MED ORDER — LORAZEPAM 1 MG PO TABS: 0.0000 mg | ORAL_TABLET | Freq: Four times a day (QID) | ORAL | Status: DC

## 2018-05-19 MED ORDER — ZOLPIDEM TARTRATE 5 MG PO TABS: 5.0000 mg | ORAL_TABLET | Freq: Every evening | ORAL | Status: DC | PRN

## 2018-05-19 MED ORDER — ONDANSETRON HCL 4 MG PO TABS: 4.0000 mg | ORAL_TABLET | Freq: Three times a day (TID) | ORAL | Status: DC | PRN

## 2018-05-19 MED ORDER — LORAZEPAM 2 MG/ML IJ SOLN: 0.0000 mg | Freq: Two times a day (BID) | INTRAMUSCULAR | Status: DC

## 2018-05-19 MED ORDER — THIAMINE HCL 100 MG/ML IJ SOLN: 100.0000 mg | Freq: Every day | INTRAMUSCULAR | Status: DC

## 2018-05-19 NOTE — ED Notes (Signed)
Pt changed into blue scrubs after bathing self in bathroom from urine. Pt verified all of his belongings were in the 2 labeled belongings bags - NO Valuables - Refused to sign and refused to sign for d/c paperwork. Pt escorted from ED by Security and [employer].

## 2018-05-19 NOTE — Consult Note (Signed)
Tele psych Assessment  Tele Assessment   John MewRodney E Bigley, 47 y.o., male [facility] complaints of chest pain and repeatedly stating he was going to die.  Patient seen via telepsych by this provider; chart reviewed and consulted with Dr. Lucianne MussKumar on 05/19/18.  On evaluation John Howe reports that he had to come to the hospital related to "[place].  Germaine PomfretMary Jane.  Do you know who Gaylord ShihRick James is."  Patient was informed that I was aware of Gaylord ShihRick James and the song Germaine PomfretMary Jane but, what did it have to do with him coming to the hospital.  Patient states that he smokes some weed yesterday and it "kicked up my heart rate"  Patient denies suicidal/self-harm/homicidal ideation, psychosis, and paranoia.    During evaluation John MewRodney E Leh is laying in bed with covers over head.  Patient had to be asked several time to pull the covers down and answer questions.  Patient stating "these are the same questions they asked me yesterday.   Patient is  alert/oriented x 4; calm/cooperative; and mood congruent with affect.  He does not appear to be responding to internal/external stimuli or delusional thoughts.  Patient denied suicidal/self-harm/homicidal ideation, psychosis, and paranoia.  Patient answered question appropriately.  Recommendations: Outpatient psychiatric treatment.  Resources/referral to community resources for substance abuse  Disposition:  Psychiatrically cleared No evidence of imminent risk to self or others at present.   Patient does not meet criteria for psychiatric inpatient admission.  Spoke with Dr. Freida BusmanAllen; informed of above recommendation and disposition  Assunta FoundShuvon Rankin, NP

## 2018-05-19 NOTE — ED Notes (Addendum)
Pt states he is not leaving when advised pt he is being d/c'd. [employer] and Security at bedside. ALL belongings - 2 labeled belongings bags - NO Valuables noted - Pt verbalized items present. Pt refused to sign for belongings and d/c paperwork. Pt given new set of blue paper scrubs and brief as requested d/t his clothing is soiled. Pt in bathroom cleansing self.

## 2018-05-19 NOTE — ED Notes (Signed)
Woke pt so VS may be taken and offered for him to eat lunch. VS taken then pt returned to sleeping.
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
## 2018-05-19 NOTE — ED Notes (Signed)
Breakfast tray ordered 

## 2018-05-19 NOTE — ED Notes (Signed)
Belongings inventoried and placed in locker 4  

## 2018-05-19 NOTE — ED Notes (Signed)
Woke pt for Telepsych.

## 2018-05-19 NOTE — ED Notes (Signed)
Speaking with pt and pt states "just leave me alone so I can go to sleep" "I'm going to die here tonight" "Noone is going to talk to me in the morning because I'm going to be dead". Assured pt that this RN will monitor him. Pt covers his head up with blankets. "I'm going to go to sleep".

## 2018-05-19 NOTE — ED Notes (Signed)
Advised pt of tx plan - d/c to home. Offered for pt to shower - declined.

## 2018-05-19 NOTE — ED Notes (Addendum)
Pt woke by staff so may eat breakfast. Sprite given as requested. States he wants to go back to sleep after finishes eating. When asked pt if he is SI/HI - pt stared at this RN briefly prior to answering - stated "You don't have to ask me that question". Pt then went back to eating and refused to answer any additional questions.

## 2018-05-19 NOTE — ED Provider Notes (Signed)
Pt seen and examined today No hi/si Seen by [facility] and cleared for discharge   Lorre NickAllen, Hannie Shoe, MD 05/19/18 1430

## 2018-05-19 NOTE — ED Notes (Signed)
Lunch tray ordered 

## 2018-05-19 NOTE — ED Notes (Signed)
Pt has returned to sleeping.  

## 2018-10-15 NOTE — ED Triage Notes (Signed)
Pt brought in by [facility] for c/o right shoulder pain and chest pain and describes it as a crushing sensation; pt is slurring his words and has an unsteady gait, but denies any alcohol or drug use tonight; pt given two nitro SL with some relief and pt given 324mg  asa; pt states the pain woke him up from sleep

## 2018-10-16 ENCOUNTER — Other Ambulatory Visit: Payer: Self-pay

## 2018-10-16 ENCOUNTER — Encounter (HOSPITAL_COMMUNITY): Payer: Self-pay | Admitting: *Deleted

## 2018-10-16 ENCOUNTER — Emergency Department (HOSPITAL_COMMUNITY)
Admission: EM | Admit: 2018-10-16 | Discharge: 2018-10-16 | Disposition: A | Discharge status: Home / Self Care | Payer: Medicaid Other | Attending: Emergency Medicine | Admitting: Emergency Medicine

## 2018-10-16 DIAGNOSIS — M25511 Pain in right shoulder: Secondary | ICD-10-CM | POA: Diagnosis not present

## 2018-10-16 DIAGNOSIS — G8929 Other chronic pain: Secondary | ICD-10-CM | POA: Insufficient documentation

## 2018-10-16 DIAGNOSIS — F17200 Nicotine dependence, unspecified, uncomplicated: Secondary | ICD-10-CM | POA: Diagnosis not present

## 2018-10-16 MED ORDER — NAPROXEN 500 MG PO TABS: 500.0000 mg | ORAL_TABLET | Freq: Two times a day (BID) | ORAL | 0 refills | Status: DC

## 2018-10-16 MED ORDER — IBUPROFEN 400 MG PO TABS
600.0000 mg | ORAL_TABLET | Freq: Once | ORAL | Status: AC
  Administered 2018-10-16: 600 mg via ORAL
  Filled 2018-10-16: qty 2

## 2018-10-16 MED ORDER — CYCLOBENZAPRINE HCL 10 MG PO TABS
5.0000 mg | ORAL_TABLET | Freq: Once | ORAL | Status: AC
  Administered 2018-10-16: 5 mg via ORAL
  Filled 2018-10-16: qty 1

## 2018-10-16 MED ORDER — CYCLOBENZAPRINE HCL 5 MG PO TABS: 5.0000 mg | ORAL_TABLET | Freq: Three times a day (TID) | ORAL | 0 refills | Status: DC | PRN
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
## 2018-10-16 NOTE — ED Notes (Signed)
Pt refuses to sign discharge pad. Pt received papers.

## 2018-10-16 NOTE — Discharge Instructions (Addendum)
Use ice and heat on your shoulder for comfort.  Take the medication as prescribed.

## 2019-07-14 IMAGING — CT CT ANGIO CHEST
2 of 6 series · 19 of 36 positions shown · IV contrast (iopamidol)
Comparison: Radiographs earlier this day.

CLINICAL DATA: Insert [REDACTED] diagnosis and indication midsternal
chest pain.

EXAM:
CT ANGIOGRAPHY CHEST WITH CONTRAST
TECHNIQUE: Multidetector CT imaging of the chest was performed using the
standard protocol during bolus administration of intravenous
contrast. Multiplanar CT image reconstructions and MIPs were
obtained to evaluate the vascular anatomy.
CONTRAST:  100mL 9PHTYW-G7X IOPAMIDOL (9PHTYW-G7X) INJECTION 76%

[Series 7: pe thins · axial · 0.81mm/px · z∈[+1120,+1423]mm · 18 of 482 slices shown]
[im 25/482  lung]
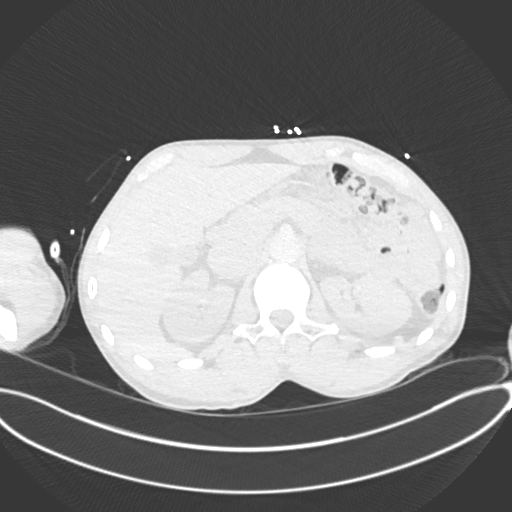
[im 49/482  mediastinal]
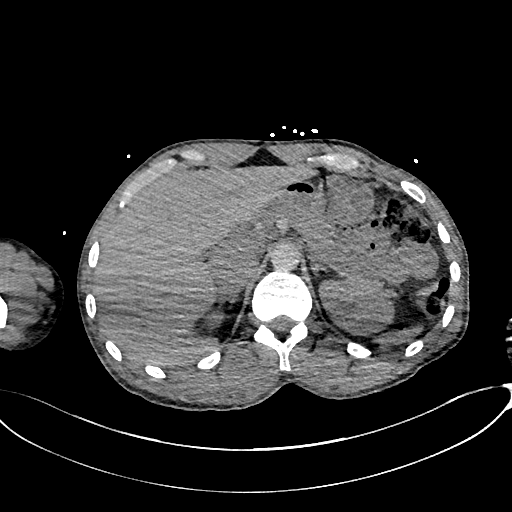
[im 73/482  lung]
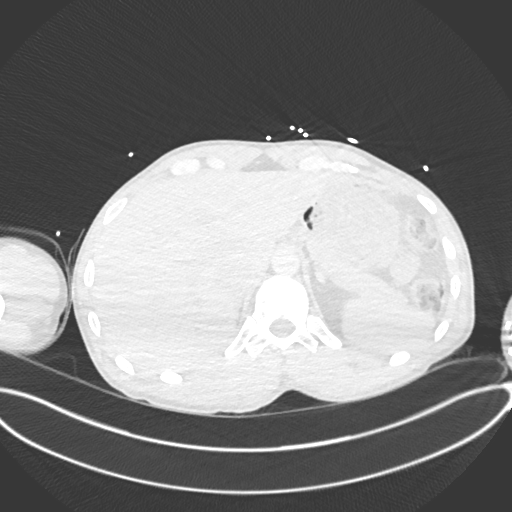
[im 97/482  mediastinal]
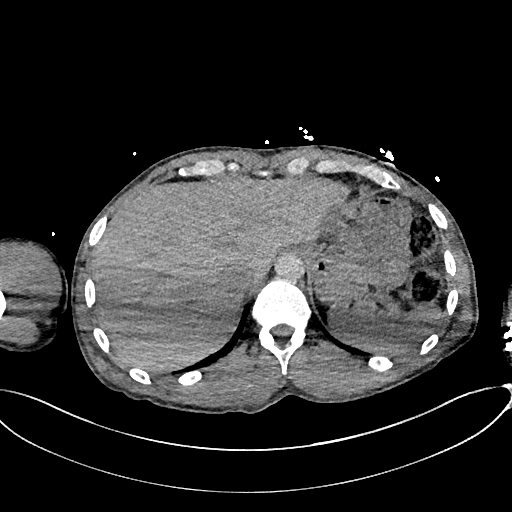
[im 121/482  lung]
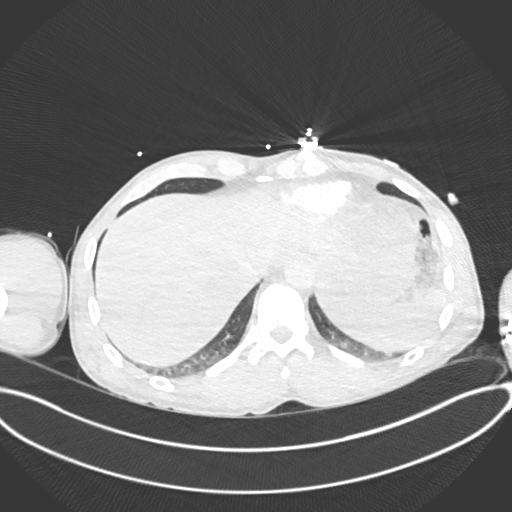
[im 145/482  mediastinal]
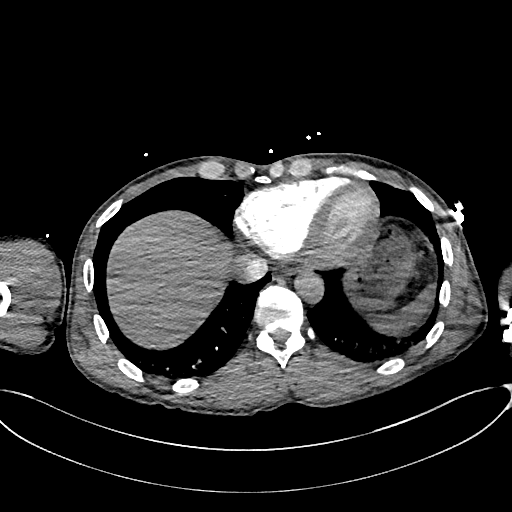
[im 169/482  lung]
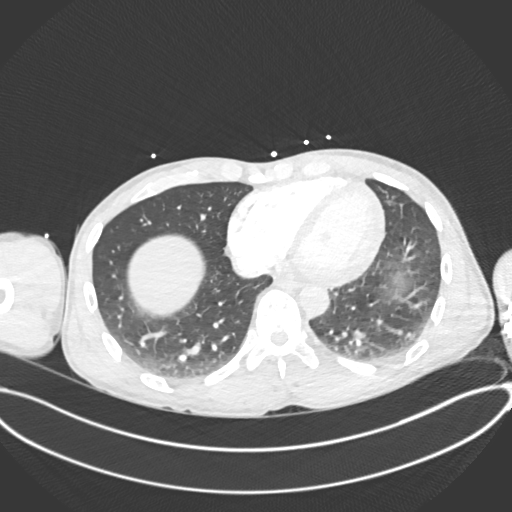
[im 193/482  mediastinal]
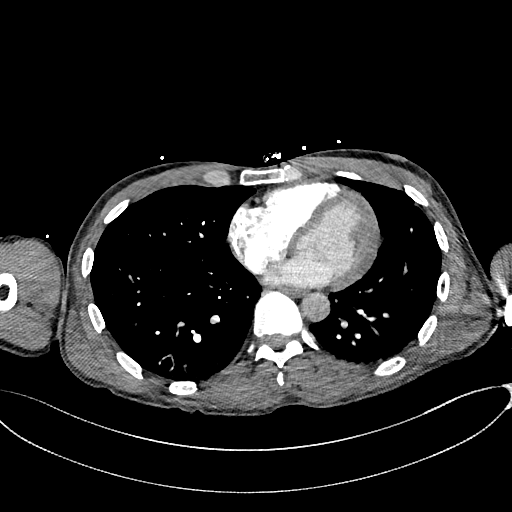
[im 217/482  lung]
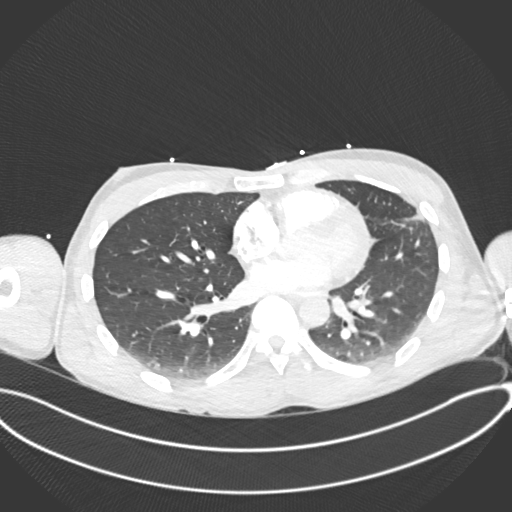
[im 265/482  mediastinal]
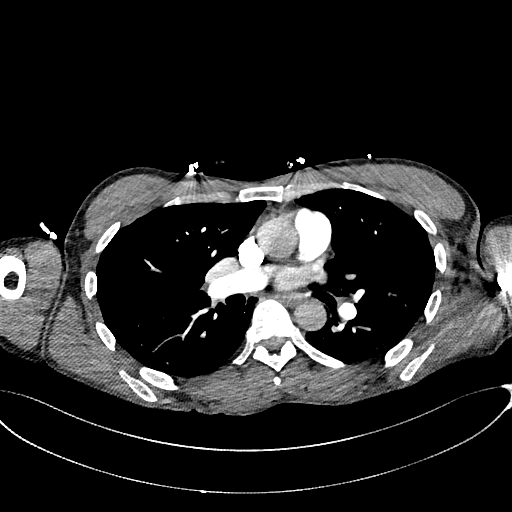
[im 289/482  lung]
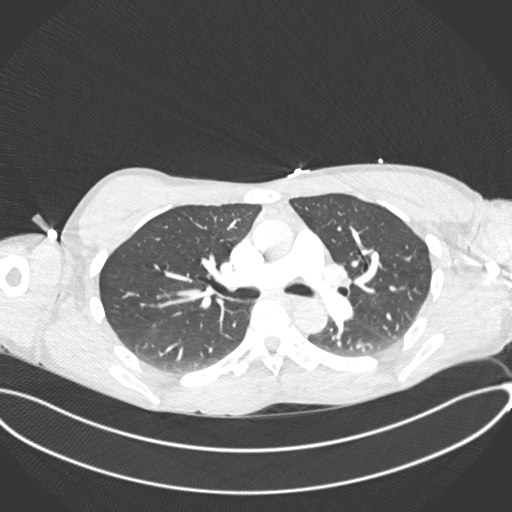
[im 313/482  mediastinal]
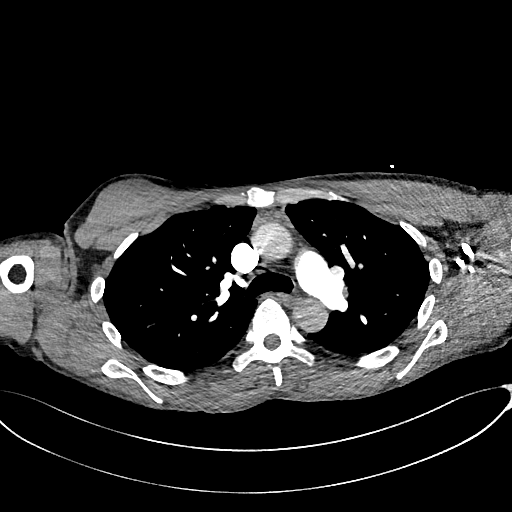
[im 337/482  lung]
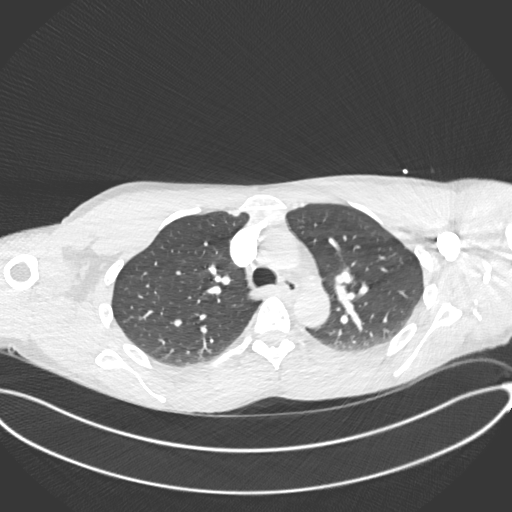
[im 361/482  mediastinal]
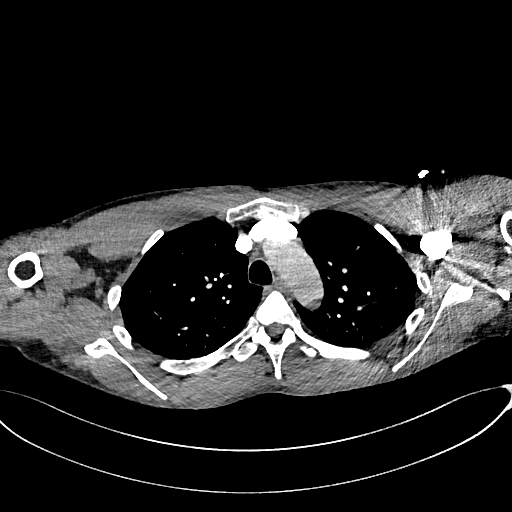
[im 385/482  lung]
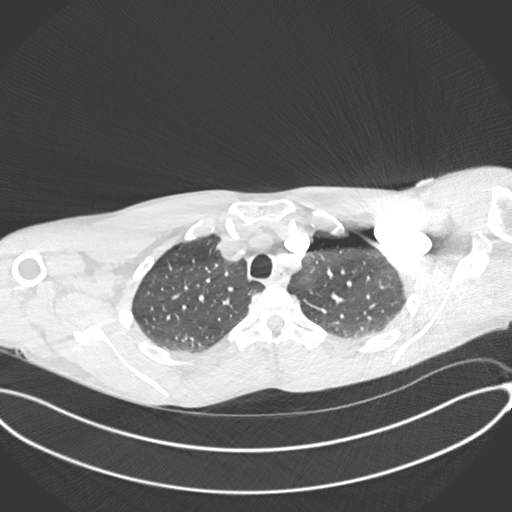
[im 409/482  mediastinal]
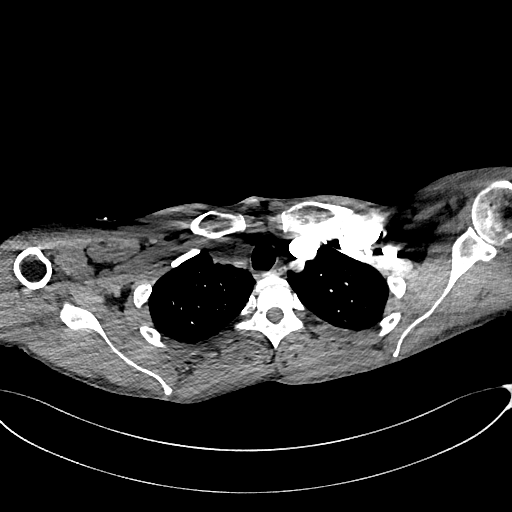
[im 433/482  lung]
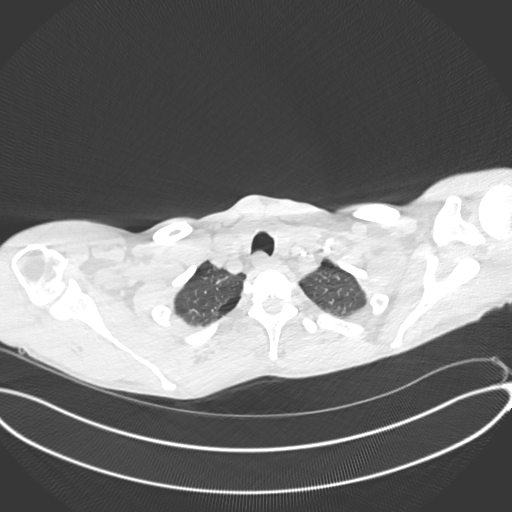
[im 457/482  mediastinal]
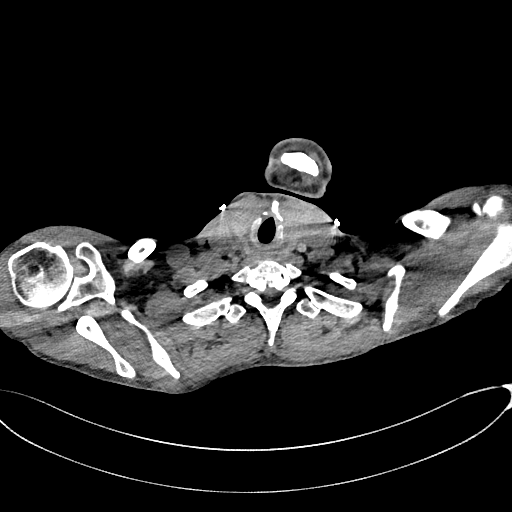

[Series 8: pe 2mm cor · coronal · 0.69mm/px · 1 of 146 slices shown]
[im 73/146  mediastinal]
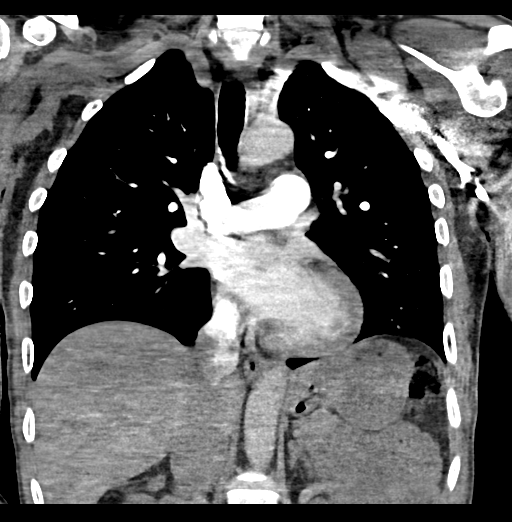

[19 of 36 positions shown; findings below may reference images not displayed]

FINDINGS: Cardiovascular: There are no filling defects within the pulmonary
arteries to suggest pulmonary embolus. Thoracic aorta is normal in
caliber, cannot assess for dissection given phase of contrast. No
periaortic stranding. Heart is normal in size. No pericardial
effusion.

Mediastinum/Nodes: Patchy soft tissue density in the anterior
mediastinum likely residual or recurrent thymus. No focal
mediastinal mass. No mediastinal or hilar adenopathy. No thyroid
nodule. The esophagus is decompressed.

Lungs/Pleura: Small right greater than left apical blebs. Mild
hypoventilatory change in the dependent lungs. Subsegmental lingular
atelectasis or scarring. No confluent consolidation. No evidence
pulmonary edema. No pleural effusion. Trachea and mainstem bronchi
are patent.

Upper Abdomen: No acute abnormality.

Musculoskeletal: There are no acute or suspicious osseous
abnormalities.

Review of the MIP images confirms the above findings.
IMPRESSION: No pulmonary embolus or acute intrathoracic process.

## 2019-12-16 ENCOUNTER — Other Ambulatory Visit: Payer: Self-pay | Admitting: Orthopaedic Surgery

## 2019-12-16 DIAGNOSIS — M25511 Pain in right shoulder: Secondary | ICD-10-CM

## 2020-01-01 ENCOUNTER — Ambulatory Visit
Admission: RE | Admit: 2020-01-01 | Discharge: 2020-01-01 | Disposition: A | Discharge status: Home / Self Care | Payer: Medicaid Other | Source: Ambulatory Visit | Attending: Orthopaedic Surgery | Admitting: Orthopaedic Surgery

## 2020-01-01 ENCOUNTER — Other Ambulatory Visit: Payer: Medicaid Other

## 2020-01-01 DIAGNOSIS — M25511 Pain in right shoulder: Secondary | ICD-10-CM

## 2020-04-15 NOTE — Patient Instructions (Addendum)
DUE TO COVID-19 ONLY ONE VISITOR IS ALLOWED TO COME WITH YOU AND STAY IN THE WAITING ROOM ONLY DURING PRE OP AND PROCEDURE DAY OF SURGERY. THE 1 VISITOR MAY VISIT WITH YOU AFTER SURGERY IN YOUR PRIVATE ROOM DURING VISITING HOURS ONLY!  YOU NEED TO HAVE A COVID 19 TEST ON: 04/23/20 @  2:00 pm, THIS TEST MUST BE DONE BEFORE SURGERY, COME  [address], [place] , [address].  [facility]) ONCE YOUR COVID TEST IS COMPLETED, PLEASE BEGIN THE QUARANTINE INSTRUCTIONS AS OUTLINED IN YOUR HANDOUT.                AMANI NODARSE    Your procedure is scheduled on: 04/27/20   Report to [facility] Main  Entrance   Report to admitting at: 11:30 AM     Call this number if you have problems the morning of surgery [phone]    Remember:   NO SOLID FOOD AFTER MIDNIGHT THE NIGHT PRIOR TO SURGERY. NOTHING BY MOUTH EXCEPT CLEAR LIQUIDS UNTIL: 11:00 am . PLEASE FINISH ENSURE DRINK PER SURGEON ORDER  WHICH NEEDS TO BE COMPLETED AT: 11:00 am .   CLEAR LIQUID DIET   Foods Allowed                                                                     Foods Excluded  Coffee and tea, regular and decaf                             liquids that you cannot  Plain Jell-O any favor except red or purple                                           see through such as: Fruit ices (not with fruit pulp)                                     milk, soups, orange juice  Iced Popsicles                                    All solid food Carbonated beverages, regular and diet                                    Cranberry, grape and apple juices Sports drinks like Gatorade Lightly seasoned clear broth or consume(fat free) Sugar, honey syrup  Sample Menu Breakfast                                Lunch                                     Supper Cranberry juice  Beef broth                            Chicken broth Jell-O                                     Grape juice                            Apple juice Coffee or tea                        Jell-O                                      Popsicle                                                Coffee or tea                        Coffee or tea  _____________________________________________________________________   BRUSH YOUR TEETH MORNING OF SURGERY AND RINSE YOUR MOUTH OUT, NO CHEWING GUM CANDY OR MINTS.                                     You may not have any metal on your body including hair pins and              piercings  Do not wear jewelry, lotions, powders or perfumes, deodorant             Men may shave face and neck.   Do not bring valuables to the hospital. [facility] IS NOT             RESPONSIBLE   FOR VALUABLES.  Contacts, dentures or bridgework may not be worn into surgery.  Leave suitcase in the car. After surgery it may be brought to your room.     Patients discharged the day of surgery will not be allowed to drive home. IF YOU ARE HAVING SURGERY AND GOING HOME THE SAME DAY, YOU MUST HAVE AN ADULT TO DRIVE YOU HOME AND BE WITH YOU FOR 24 HOURS. YOU MAY GO HOME BY TAXI OR UBER OR ORTHERWISE, BUT AN ADULT MUST ACCOMPANY YOU HOME AND STAY WITH YOU FOR 24 HOURS.  Name and phone number of your driver:  Special Instructions: N/A              Please read over the following fact sheets you were given: _____________________________________________________________________         [facility] - Preparing for Surgery Before surgery, you can play an important role.  Because skin is not sterile, your skin needs to be as free of germs as possible.  You can reduce the number of germs on your skin by washing with CHG (chlorahexidine gluconate) soap before surgery.  CHG is an antiseptic cleaner which kills germs and bonds with the skin to continue killing germs even after washing. Please DO NOT use if you have an  allergy to CHG or antibacterial soaps.  If your skin becomes reddened/irritated stop using the CHG and inform  your nurse when you arrive at Short Stay. Do not shave (including legs and underarms) for at least 48 hours prior to the first CHG shower.  You may shave your face/neck. Please follow these instructions carefully:  1.  Shower with CHG Soap the night before surgery and the  morning of Surgery.  2.  If you choose to wash your hair, wash your hair first as usual with your  normal  shampoo.  3.  After you shampoo, rinse your hair and body thoroughly to remove the  shampoo.                           4.  Use CHG as you would any other liquid soap.  You can apply chg directly  to the skin and wash                       Gently with a scrungie or clean washcloth.  5.  Apply the CHG Soap to your body ONLY FROM THE NECK DOWN.   Do not use on face/ open                           Wound or open sores. Avoid contact with eyes, ears mouth and genitals (private parts).                       Wash face,  Genitals (private parts) with your normal soap.             6.  Wash thoroughly, paying special attention to the area where your surgery  will be performed.  7.  Thoroughly rinse your body with warm water from the neck down.  8.  DO NOT shower/wash with your normal soap after using and rinsing off  the CHG Soap.                9.  Pat yourself dry with a clean towel.            10.  Wear clean pajamas.            11.  Place clean sheets on your bed the night of your first shower and do not  sleep with pets. Day of Surgery : Do not apply any lotions/deodorants the morning of surgery.  Please wear clean clothes to the hospital/surgery center.  FAILURE TO FOLLOW THESE INSTRUCTIONS MAY RESULT IN THE CANCELLATION OF YOUR SURGERY PATIENT SIGNATURE_________________________________  NURSE SIGNATURE__________________________________  ________________________________________________________________________   Rogelia Mire  An incentive spirometer is a tool that can help keep your lungs clear and active. This  tool measures how well you are filling your lungs with each breath. Taking long deep breaths may help reverse or decrease the chance of developing breathing (pulmonary) problems (especially infection) following:  A long period of time when you are unable to move or be active. BEFORE THE PROCEDURE   If the spirometer includes an indicator to show your best effort, your nurse or respiratory therapist will set it to a desired goal.  If possible, sit up straight or lean slightly forward. Try not to slouch.  Hold the incentive spirometer in an upright position. INSTRUCTIONS FOR USE  1. Sit on the edge of your bed if possible, or sit up as far  as you can in bed or on a chair. 2. Hold the incentive spirometer in an upright position. 3. Breathe out normally. 4. Place the mouthpiece in your mouth and seal your lips tightly around it. 5. Breathe in slowly and as deeply as possible, raising the piston or the ball toward the top of the column. 6. Hold your breath for 3-5 seconds or for as long as possible. Allow the piston or ball to fall to the bottom of the column. 7. Remove the mouthpiece from your mouth and breathe out normally. 8. Rest for a few seconds and repeat Steps 1 through 7 at least 10 times every 1-2 hours when you are awake. Take your time and take a few normal breaths between deep breaths. 9. The spirometer may include an indicator to show your best effort. Use the indicator as a goal to work toward during each repetition. 10. After each set of 10 deep breaths, practice coughing to be sure your lungs are clear. If you have an incision (the cut made at the time of surgery), support your incision when coughing by placing a pillow or rolled up towels firmly against it. Once you are able to get out of bed, walk around indoors and cough well. You may stop using the incentive spirometer when instructed by your caregiver.  RISKS AND COMPLICATIONS  Take your time so you do not get dizzy or  light-headed.  If you are in pain, you may need to take or ask for pain medication before doing incentive spirometry. It is harder to take a deep breath if you are having pain. AFTER USE  Rest and breathe slowly and easily.  It can be helpful to keep track of a log of your progress. Your caregiver can provide you with a simple table to help with this. If you are using the spirometer at home, follow these instructions: SEEK MEDICAL CARE IF:   You are having difficultly using the spirometer.  You have trouble using the spirometer as often as instructed.  Your pain medication is not giving enough relief while using the spirometer.  You develop fever of 100.5 F (38.1 C) or higher. SEEK IMMEDIATE MEDICAL CARE IF:   You cough up bloody sputum that had not been present before.  You develop fever of 102 F (38.9 C) or greater.  You develop worsening pain at or near the incision site. MAKE SURE YOU:   Understand these instructions.  Will watch your condition.  Will get help right away if you are not doing well or get worse. Document Released: 01/22/2007 Document Revised: 12/04/2011 Document Reviewed: 03/25/2007 [facility] Patient Information 2014 [employer], [place].   ________________________________________________________________________

## 2020-04-20 ENCOUNTER — Encounter (HOSPITAL_COMMUNITY)
Admission: RE | Admit: 2020-04-20 | Discharge: 2020-04-20 | Disposition: A | Discharge status: Home / Self Care | Payer: Medicaid Other | Source: Ambulatory Visit | Attending: Orthopedic Surgery | Admitting: Orthopedic Surgery

## 2020-04-20 ENCOUNTER — Other Ambulatory Visit: Payer: Self-pay

## 2020-04-20 ENCOUNTER — Encounter (HOSPITAL_COMMUNITY): Payer: Self-pay

## 2020-04-20 DIAGNOSIS — Z01818 Encounter for other preprocedural examination: Secondary | ICD-10-CM | POA: Diagnosis not present

## 2020-04-20 DIAGNOSIS — M19011 Primary osteoarthritis, right shoulder: Secondary | ICD-10-CM | POA: Diagnosis not present

## 2020-04-20 LAB — CBC
HCT: 43.2 % (ref 39.0–52.0)
Hemoglobin: 13.7 g/dL (ref 13.0–17.0)
MCH: 29.8 pg (ref 26.0–34.0)
MCHC: 31.7 g/dL (ref 30.0–36.0)
MCV: 93.9 fL (ref 80.0–100.0)
Platelets: 284 10*3/uL (ref 150–400)
RBC: 4.6 MIL/uL (ref 4.22–5.81)
RDW: 13.1 % (ref 11.5–15.5)
WBC: 5.3 10*3/uL (ref 4.0–10.5)
nRBC: 0 % (ref 0.0–0.2)

## 2020-04-20 LAB — BASIC METABOLIC PANEL
Anion gap: 7 (ref 5–15)
BUN: 15 mg/dL (ref 6–20)
CO2: 28 mmol/L (ref 22–32)
Calcium: 9.2 mg/dL (ref 8.9–10.3)
Chloride: 110 mmol/L (ref 98–111)
Creatinine, Ser: 1.32 mg/dL — ABNORMAL HIGH (ref 0.61–1.24)
GFR calc Af Amer: >60 mL/min (ref >60)
GFR calc non Af Amer: >60 mL/min (ref >60)
Glucose, Bld: 95 mg/dL (ref 70–99)
Potassium: 4.1 mmol/L (ref 3.5–5.1)
Sodium: 145 mmol/L (ref 135–145)

## 2020-04-20 LAB — SURGICAL PCR SCREEN
MRSA, PCR: NEGATIVE
Staphylococcus aureus: NEGATIVE

## 2020-04-20 NOTE — Progress Notes (Addendum)
COVID Vaccine Completed:yes Date COVID Vaccine completed: 03/14/20 COVID vaccine manufacturer:*Pfizer    Moderna   [employer]   PCP - Boris Sharper. NP Cardiologist - No  Chest x-ray -  EKG -  Stress Test -  ECHO -  Cardiac Cath -   Sleep Study -  CPAP -   Fasting Blood Sugar -  Checks Blood Sugar _____ times a day  Blood Thinner Instructions: Aspirin Instructions: Last Dose:  Anesthesia review: Hx: Seizures(last one 8 years ago as per pt.),Dysrhythmias,MRSA. Pt. Was advised to stop smoking before surgery.  Patient denies shortness of breath, fever, cough and chest pain at PAT appointment   Patient verbalized understanding of instructions that were given to them at the PAT appointment. Patient was also instructed that they will need to review over the PAT instructions again at home before surgery.
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
## 2020-04-21 NOTE — Anesthesia Preprocedure Evaluation (Addendum)
Anesthesia Evaluation  Patient identified by MRN, date of birth, ID band Patient awake    Reviewed: Allergy & Precautions, NPO status , Patient's Chart, lab work & pertinent test results  Airway Mallampati: I       Dental no notable dental hx. (+) Teeth Intact   Pulmonary neg pulmonary ROS, Current Smoker,    Pulmonary exam normal breath sounds clear to auscultation       Cardiovascular negative cardio ROS Normal cardiovascular exam Rhythm:Regular Rate:Normal     Neuro/Psych Seizures -, Well Controlled,     GI/Hepatic negative GI ROS, Neg liver ROS,   Endo/Other  negative endocrine ROS  Renal/GU   negative genitourinary   Musculoskeletal negative musculoskeletal ROS (+)   Abdominal Normal abdominal exam  (+)   Peds  Hematology negative hematology ROS (+)   Anesthesia Other Findings   Reproductive/Obstetrics                            Anesthesia Physical Anesthesia Plan  ASA: II  Anesthesia Plan: General   Post-op Pain Management:  Regional for Post-op pain   Induction:   PONV Risk Score and Plan: 1 and Ondansetron, Dexamethasone and Midazolam  Airway Management Planned: Oral ETT  Additional Equipment: None  Intra-op Plan:   Post-operative Plan: Extubation in OR  Informed Consent: I have reviewed the patients History and Physical, chart, labs and discussed the procedure including the risks, benefits and alternatives for the proposed anesthesia with the patient or authorized representative who has indicated his/her understanding and acceptance.     Dental advisory given  Plan Discussed with: CRNA  Anesthesia Plan Comments: (S/p right shoulder arthroscopy 10/03/2013.  Per anesthesia note Intubation with 2 attempts.  "First attempt with Sabra Heck 3, no structures identified.  Second attempt with Mac 4.")       Anesthesia Quick Evaluation

## 2020-04-23 ENCOUNTER — Other Ambulatory Visit (HOSPITAL_COMMUNITY)
Admission: RE | Admit: 2020-04-23 | Discharge: 2020-04-23 | Disposition: A | Discharge status: Home / Self Care | Payer: Medicaid Other | Source: Ambulatory Visit | Attending: Orthopedic Surgery | Admitting: Orthopedic Surgery

## 2020-04-23 DIAGNOSIS — Z20822 Contact with and (suspected) exposure to covid-19: Secondary | ICD-10-CM | POA: Diagnosis not present

## 2020-04-23 DIAGNOSIS — Z01812 Encounter for preprocedural laboratory examination: Secondary | ICD-10-CM | POA: Diagnosis present

## 2020-04-23 LAB — SARS CORONAVIRUS 2 (TAT 6-24 HRS): SARS Coronavirus 2: NEGATIVE

## 2020-04-27 ENCOUNTER — Ambulatory Visit (HOSPITAL_COMMUNITY): Payer: Medicaid Other | Admitting: Anesthesiology

## 2020-04-27 ENCOUNTER — Encounter (HOSPITAL_COMMUNITY)
Admission: RE | Disposition: A | Discharge status: Home / Self Care | Payer: Self-pay | Source: Other Acute Inpatient Hospital | Attending: Orthopedic Surgery

## 2020-04-27 ENCOUNTER — Observation Stay (HOSPITAL_COMMUNITY): Payer: Medicaid Other

## 2020-04-27 ENCOUNTER — Other Ambulatory Visit: Payer: Self-pay

## 2020-04-27 ENCOUNTER — Ambulatory Visit (HOSPITAL_COMMUNITY): Payer: Medicaid Other | Admitting: Physician Assistant

## 2020-04-27 ENCOUNTER — Encounter (HOSPITAL_COMMUNITY): Payer: Self-pay | Admitting: Orthopedic Surgery

## 2020-04-27 ENCOUNTER — Observation Stay (HOSPITAL_COMMUNITY)
Admission: RE | Admit: 2020-04-27 | Discharge: 2020-04-28 | Disposition: A | Discharge status: Home / Self Care | Payer: Medicaid Other | Source: Other Acute Inpatient Hospital | Attending: Orthopedic Surgery | Admitting: Orthopedic Surgery

## 2020-04-27 DIAGNOSIS — M19011 Primary osteoarthritis, right shoulder: Principal | ICD-10-CM | POA: Insufficient documentation

## 2020-04-27 DIAGNOSIS — M19111 Post-traumatic osteoarthritis, right shoulder: Secondary | ICD-10-CM | POA: Diagnosis present

## 2020-04-27 DIAGNOSIS — M25511 Pain in right shoulder: Secondary | ICD-10-CM | POA: Diagnosis present

## 2020-04-27 DIAGNOSIS — F17210 Nicotine dependence, cigarettes, uncomplicated: Secondary | ICD-10-CM | POA: Insufficient documentation

## 2020-04-27 DIAGNOSIS — Z96611 Presence of right artificial shoulder joint: Secondary | ICD-10-CM

## 2020-04-27 HISTORY — PX: REVERSE SHOULDER ARTHROPLASTY: SHX5054

## 2020-04-27 LAB — RAPID URINE DRUG SCREEN, HOSP PERFORMED
Amphetamines: NOT DETECTED
Barbiturates: NOT DETECTED
Benzodiazepines: NOT DETECTED
Cocaine: NOT DETECTED
Opiates: NOT DETECTED
Tetrahydrocannabinol: NOT DETECTED

## 2020-04-27 SURGERY — ARTHROPLASTY, SHOULDER, TOTAL, REVERSE
Anesthesia: General | Site: Shoulder | Laterality: Right

## 2020-04-27 MED ORDER — ONDANSETRON HCL 4 MG/2ML IJ SOLN
INTRAMUSCULAR | Status: DC | PRN
  Administered 2020-04-27: 4 mg via INTRAVENOUS

## 2020-04-27 MED ORDER — HYDROMORPHONE HCL 1 MG/ML IJ SOLN: 0.2500 mg | INTRAMUSCULAR | Status: DC | PRN

## 2020-04-27 MED ORDER — FENTANYL CITRATE (PF) 100 MCG/2ML IJ SOLN
INTRAMUSCULAR | Status: DC | PRN
  Administered 2020-04-27: 50 ug via INTRAVENOUS

## 2020-04-27 MED ORDER — BUPIVACAINE-EPINEPHRINE (PF) 0.5% -1:200000 IJ SOLN
INTRAMUSCULAR | Status: DC | PRN
Start: 2020-04-27 — End: 2020-04-27
  Administered 2020-04-27 (×5): 3 mL via PERINEURAL

## 2020-04-27 MED ORDER — ROCURONIUM BROMIDE 10 MG/ML (PF) SYRINGE
PREFILLED_SYRINGE | INTRAVENOUS | Status: DC | PRN
  Administered 2020-04-27: 70 mg via INTRAVENOUS

## 2020-04-27 MED ORDER — STERILE WATER FOR IRRIGATION IR SOLN
Status: DC | PRN
  Administered 2020-04-27: 2000 mL

## 2020-04-27 MED ORDER — BACLOFEN 10 MG PO TABS
10.0000 mg | ORAL_TABLET | Freq: Three times a day (TID) | ORAL | 0 refills | Status: DC
Start: 2020-04-27 — End: 2020-07-10

## 2020-04-27 MED ORDER — PHENYLEPHRINE 40 MCG/ML (10ML) SYRINGE FOR IV PUSH (FOR BLOOD PRESSURE SUPPORT)
PREFILLED_SYRINGE | INTRAVENOUS | Status: DC | PRN
  Administered 2020-04-27 (×2): 80 ug via INTRAVENOUS
  Administered 2020-04-27: 40 ug via INTRAVENOUS
  Administered 2020-04-27: 120 ug via INTRAVENOUS

## 2020-04-27 MED ORDER — MORPHINE SULFATE (PF) 4 MG/ML IV SOLN: 0.5000 mg | INTRAVENOUS | Status: DC | PRN

## 2020-04-27 MED ORDER — PHENYLEPHRINE HCL (PRESSORS) 10 MG/ML IV SOLN
INTRAVENOUS | Status: AC
  Filled 2020-04-27: qty 1

## 2020-04-27 MED ORDER — LIDOCAINE 2% (20 MG/ML) 5 ML SYRINGE
INTRAMUSCULAR | Status: DC | PRN
  Administered 2020-04-27: 40 mg via INTRAVENOUS

## 2020-04-27 MED ORDER — BUPIVACAINE LIPOSOME 1.3 % IJ SUSP
INTRAMUSCULAR | Status: DC | PRN
  Administered 2020-04-27 (×5): 2 mL via PERINEURAL

## 2020-04-27 MED ORDER — CHLORHEXIDINE GLUCONATE 0.12 % MT SOLN
15.0000 mL | Freq: Once | OROMUCOSAL | Status: AC
  Administered 2020-04-27: 15 mL via OROMUCOSAL

## 2020-04-27 MED ORDER — FENTANYL CITRATE (PF) 100 MCG/2ML IJ SOLN
INTRAMUSCULAR | Status: AC
  Filled 2020-04-27: qty 2

## 2020-04-27 MED ORDER — PROMETHAZINE HCL 25 MG/ML IJ SOLN: 6.2500 mg | INTRAMUSCULAR | Status: DC | PRN

## 2020-04-27 MED ORDER — KETOROLAC TROMETHAMINE 30 MG/ML IJ SOLN: 30.0000 mg | Freq: Once | INTRAMUSCULAR | Status: DC | PRN

## 2020-04-27 MED ORDER — HYDROCODONE-ACETAMINOPHEN 7.5-325 MG PO TABS
1.0000 | ORAL_TABLET | ORAL | Status: DC | PRN
  Administered 2020-04-27: 1 via ORAL
  Administered 2020-04-28 (×3): 2 via ORAL
  Filled 2020-04-27 (×3): qty 2

## 2020-04-27 MED ORDER — ACETAMINOPHEN 500 MG PO TABS
1000.0000 mg | ORAL_TABLET | Freq: Once | ORAL | Status: AC
  Administered 2020-04-27: 1000 mg via ORAL
  Filled 2020-04-27: qty 2

## 2020-04-27 MED ORDER — DEXAMETHASONE SODIUM PHOSPHATE 10 MG/ML IJ SOLN
INTRAMUSCULAR | Status: AC
  Filled 2020-04-27: qty 1

## 2020-04-27 MED ORDER — HYDROCODONE-ACETAMINOPHEN 7.5-325 MG PO TABS
ORAL_TABLET | ORAL | Status: AC
  Filled 2020-04-27: qty 1

## 2020-04-27 MED ORDER — HYDROCODONE-ACETAMINOPHEN 5-325 MG PO TABS: 1.0000 | ORAL_TABLET | ORAL | Status: DC | PRN

## 2020-04-27 MED ORDER — CEFAZOLIN SODIUM-DEXTROSE 2-4 GM/100ML-% IV SOLN
2.0000 g | INTRAVENOUS | Status: AC
  Administered 2020-04-27: 2 g via INTRAVENOUS
  Filled 2020-04-27: qty 100

## 2020-04-27 MED ORDER — MEPERIDINE HCL 50 MG/ML IJ SOLN: 6.2500 mg | INTRAMUSCULAR | Status: DC | PRN

## 2020-04-27 MED ORDER — 0.9 % SODIUM CHLORIDE (POUR BTL) OPTIME
TOPICAL | Status: DC | PRN
  Administered 2020-04-27: 1000 mL

## 2020-04-27 MED ORDER — ONDANSETRON HCL 4 MG/2ML IJ SOLN
INTRAMUSCULAR | Status: AC
  Filled 2020-04-27: qty 2

## 2020-04-27 MED ORDER — ONDANSETRON HCL 4 MG PO TABS: 4.0000 mg | ORAL_TABLET | Freq: Three times a day (TID) | ORAL | 0 refills | Status: DC | PRN

## 2020-04-27 MED ORDER — FENTANYL CITRATE (PF) 100 MCG/2ML IJ SOLN
50.0000 ug | INTRAMUSCULAR | Status: DC
  Administered 2020-04-27: 100 ug via INTRAVENOUS
  Filled 2020-04-27: qty 2

## 2020-04-27 MED ORDER — PHENYLEPHRINE HCL-NACL 10-0.9 MG/250ML-% IV SOLN
INTRAVENOUS | Status: DC | PRN
Start: 2020-04-27 — End: 2020-04-27
  Administered 2020-04-27: 20 ug/min via INTRAVENOUS

## 2020-04-27 MED ORDER — POVIDONE-IODINE 10 % EX SWAB
2.0000 "application " | Freq: Once | CUTANEOUS | Status: AC
  Administered 2020-04-27: 2 via TOPICAL

## 2020-04-27 MED ORDER — ROCURONIUM BROMIDE 10 MG/ML (PF) SYRINGE
PREFILLED_SYRINGE | INTRAVENOUS | Status: AC
  Filled 2020-04-27: qty 10

## 2020-04-27 MED ORDER — PHENYLEPHRINE 40 MCG/ML (10ML) SYRINGE FOR IV PUSH (FOR BLOOD PRESSURE SUPPORT)
PREFILLED_SYRINGE | INTRAVENOUS | Status: AC
  Filled 2020-04-27: qty 10

## 2020-04-27 MED ORDER — MIDAZOLAM HCL 2 MG/2ML IJ SOLN
1.0000 mg | INTRAMUSCULAR | Status: DC
  Administered 2020-04-27: 2 mg via INTRAVENOUS
  Filled 2020-04-27: qty 2

## 2020-04-27 MED ORDER — ORAL CARE MOUTH RINSE: 15.0000 mL | Freq: Once | OROMUCOSAL | Status: AC

## 2020-04-27 MED ORDER — DEXAMETHASONE SODIUM PHOSPHATE 10 MG/ML IJ SOLN
INTRAMUSCULAR | Status: DC | PRN
  Administered 2020-04-27: 10 mg via INTRAVENOUS

## 2020-04-27 MED ORDER — LACTATED RINGERS IV SOLN: INTRAVENOUS | Status: DC

## 2020-04-27 MED ORDER — LIDOCAINE 2% (20 MG/ML) 5 ML SYRINGE
INTRAMUSCULAR | Status: AC
  Filled 2020-04-27: qty 5

## 2020-04-27 MED ORDER — PROPOFOL 10 MG/ML IV BOLUS
INTRAVENOUS | Status: DC | PRN
  Administered 2020-04-27: 200 mg via INTRAVENOUS

## 2020-04-27 MED ORDER — SUGAMMADEX SODIUM 200 MG/2ML IV SOLN
INTRAVENOUS | Status: DC | PRN
  Administered 2020-04-27: 200 mg via INTRAVENOUS

## 2020-04-27 MED ORDER — HYDROCODONE-ACETAMINOPHEN 7.5-325 MG PO TABS
ORAL_TABLET | ORAL | Status: AC
  Administered 2020-04-27: 1 via ORAL
  Filled 2020-04-27: qty 1

## 2020-04-27 SURGICAL SUPPLY — 65 items
BAG ZIPLOCK 12X15 (MISCELLANEOUS) ×3 IMPLANT
BASEPLATE GLENOSPHERE 25 (Plate) ×2 IMPLANT
BASEPLATE GLENOSPHERE 25MM (Plate) ×1 IMPLANT
BEARING HUMERAL SHLDER 36M STD (Shoulder) ×1 IMPLANT
BIT DRILL QUICK REL 1/8 2PK SL (DRILL) ×1 IMPLANT
BIT DRILL TWIST 2.7 (BIT) ×2 IMPLANT
BIT DRILL TWIST 2.7MM (BIT) ×1
BLADE SAW SAG 73X25 THK (BLADE) ×2
BLADE SAW SGTL 73X25 THK (BLADE) ×1 IMPLANT
BOOTIES KNEE HIGH SLOAN (MISCELLANEOUS) ×3 IMPLANT
BOWL SMART MIX CTS (DISPOSABLE) IMPLANT
CLOSURE STERI-STRIP 1/2X4 (GAUZE/BANDAGES/DRESSINGS) ×1
CLOSURE WOUND 1/2 X4 (GAUZE/BANDAGES/DRESSINGS) ×1
CLSR STERI-STRIP ANTIMIC 1/2X4 (GAUZE/BANDAGES/DRESSINGS) ×2 IMPLANT
COVER BACK TABLE 60X90IN (DRAPES) ×3 IMPLANT
COVER MAYO STAND STRL (DRAPES) ×3 IMPLANT
COVER SURGICAL LIGHT HANDLE (MISCELLANEOUS) ×3 IMPLANT
COVER WAND RF STERILE (DRAPES) IMPLANT
DECANTER SPIKE VIAL GLASS SM (MISCELLANEOUS) IMPLANT
DRAPE SHEET LG 3/4 BI-LAMINATE (DRAPES) ×6 IMPLANT
DRAPE SURG 17X11 SM STRL (DRAPES) ×3 IMPLANT
DRAPE U-SHAPE 47X51 STRL (DRAPES) ×3 IMPLANT
DRILL QUICK RELEASE 1/8 INCH (DRILL) ×3
DRSG MEPILEX BORDER 4X8 (GAUZE/BANDAGES/DRESSINGS) ×3 IMPLANT
DURAPREP 26ML APPLICATOR (WOUND CARE) ×3 IMPLANT
ELECT REM PT RETURN 15FT ADLT (MISCELLANEOUS) ×3 IMPLANT
GLENOID SPHERE STD STRL 36MM (Orthopedic Implant) ×3 IMPLANT
GLOVE BIO SURGEON STRL SZ7 (GLOVE) ×3 IMPLANT
GLOVE BIOGEL PI IND STRL 7.0 (GLOVE) ×1 IMPLANT
GLOVE BIOGEL PI IND STRL 8 (GLOVE) ×1 IMPLANT
GLOVE BIOGEL PI INDICATOR 7.0 (GLOVE) ×2
GLOVE BIOGEL PI INDICATOR 8 (GLOVE) ×2
GLOVE ORTHO TXT STRL SZ7.5 (GLOVE) ×3 IMPLANT
GOWN STRL REUS W/TWL 2XL LVL3 (GOWN DISPOSABLE) ×3 IMPLANT
GOWN STRL REUS W/TWL LRG LVL3 (GOWN DISPOSABLE) ×3 IMPLANT
HOOD PEEL AWAY FLYTE STAYCOOL (MISCELLANEOUS) ×6 IMPLANT
KIT BASIN OR (CUSTOM PROCEDURE TRAY) ×3 IMPLANT
KIT TURNOVER KIT A (KITS) IMPLANT
PACK SHOULDER (CUSTOM PROCEDURE TRAY) ×3 IMPLANT
PENCIL SMOKE EVACUATOR (MISCELLANEOUS) IMPLANT
PIN THREADED REVERSE (PIN) ×3 IMPLANT
PROTECTOR NERVE ULNAR (MISCELLANEOUS) ×3 IMPLANT
RESTRAINT HEAD UNIVERSAL NS (MISCELLANEOUS) ×3 IMPLANT
SCREW BONE LOCKING 4.75X30X3.5 (Screw) ×6 IMPLANT
SCREW BONE STRL 6.5MMX45MM (Screw) ×3 IMPLANT
SCREW LOCKING 4.75MMX15MM (Screw) ×3 IMPLANT
SCREW LOCKING STRL 4.75X25X3.5 (Screw) ×3 IMPLANT
SHOULDER HUMERAL BEAR 36M STD (Shoulder) ×3 IMPLANT
SLING ARM IMMOBILIZER LRG (SOFTGOODS) ×3 IMPLANT
STEM HUMERAL STRL 13MMX55MM (Stem) ×3 IMPLANT
STRIP CLOSURE SKIN 1/2X4 (GAUZE/BANDAGES/DRESSINGS) ×2 IMPLANT
SUCTION FRAZIER HANDLE 12FR (TUBING) ×3
SUCTION TUBE FRAZIER 12FR DISP (TUBING) ×1 IMPLANT
SUPPORT WRAP ARM LG (MISCELLANEOUS) ×3 IMPLANT
SUT FIBERWIRE #2 38 REV NDL BL (SUTURE) ×12
SUT MAXBRAID #2 CVD NDL (SUTURE) ×3 IMPLANT
SUT VIC AB 1 CT1 36 (SUTURE) ×3 IMPLANT
SUT VIC AB 2-0 CT1 27 (SUTURE) ×3
SUT VIC AB 2-0 CT1 TAPERPNT 27 (SUTURE) ×1 IMPLANT
SUT VIC AB 3-0 SH 8-18 (SUTURE) ×3 IMPLANT
SUTURE FIBERWR#2 38 REV NDL BL (SUTURE) ×4 IMPLANT
TOWEL OR 17X26 10 PK STRL BLUE (TOWEL DISPOSABLE) ×3 IMPLANT
TOWEL OR NON WOVEN STRL DISP B (DISPOSABLE) ×3 IMPLANT
TOWER CARTRIDGE SMART MIX (DISPOSABLE) IMPLANT
TRAY HUM MINI SHOULDER +3 40 (Joint) ×3 IMPLANT

## 2020-04-27 NOTE — Anesthesia Procedure Notes (Signed)
Procedure Name: Intubation Date/Time: 04/27/2020 1:59 PM Performed by: Genelle Bal, CRNA Pre-anesthesia Checklist: Patient identified, Emergency Drugs available, Suction available and Patient being monitored Patient Re-evaluated:Patient Re-evaluated prior to induction Oxygen Delivery Method: Circle system utilized Preoxygenation: Pre-oxygenation with 100% oxygen Induction Type: IV induction Ventilation: Mask ventilation without difficulty Laryngoscope Size: Mac and 4 Grade View: Grade I Tube type: Oral Tube size: 7.5 mm Number of attempts: 1 Airway Equipment and Method: Stylet and Oral airway Placement Confirmation: ETT inserted through vocal cords under direct vision,  positive ETCO2 and breath sounds checked- equal and bilateral Secured at: 22 cm Tube secured with: Tape Dental Injury: Teeth and Oropharynx as per pre-operative assessment
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
## 2020-04-27 NOTE — Anesthesia Postprocedure Evaluation (Signed)
Anesthesia Post Note  Patient: John Howe  Procedure(s) Performed: REVERSE SHOULDER ARTHROPLASTY (Right Shoulder)     Patient location during evaluation: PACU Anesthesia Type: General Level of consciousness: sedated Pain management: pain level controlled Vital Signs Assessment: post-procedure vital signs reviewed and stable Respiratory status: spontaneous breathing Cardiovascular status: stable Postop Assessment: no apparent nausea or vomiting Anesthetic complications: no   No complications documented.  Last Vitals:  Vitals:   04/27/20 1645 04/27/20 1700  BP: 121/85   Pulse: 86 71  Resp: 14 15  Temp:    SpO2: 99% 98%    Last Pain:  Vitals:   04/27/20 1700  TempSrc:   PainSc: Asleep   Pain Goal: Patients Stated Pain Goal: 4 (04/27/20 1145)                 Caren Macadam

## 2020-04-27 NOTE — Transfer of Care (Signed)
Immediate Anesthesia Transfer of Care Note  Patient: John Howe  Procedure(s) Performed: REVERSE SHOULDER ARTHROPLASTY (Right Shoulder)  Patient Location: PACU  Anesthesia Type:General  Level of Consciousness: sedated  Airway & Oxygen Therapy: Patient Spontanous Breathing and Patient connected to face mask oxygen  Post-op Assessment: Report given to RN and Post -op Vital signs reviewed and stable  Post vital signs: Reviewed and stable  Last Vitals:  Vitals Value Taken Time  BP 123/80 04/27/20 1626  Temp    Pulse 62 04/27/20 1627  Resp 14 04/27/20 1627  SpO2 100 % 04/27/20 1627  Vitals shown include unvalidated device data.  Last Pain:  Vitals:   04/27/20 1145  TempSrc:   PainSc: 7       Patients Stated Pain Goal: 4 (04/27/20 1145)  Complications: No complications documented.

## 2020-04-27 NOTE — Op Note (Signed)
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  ANESTHESIA:   General with interscalene block using Exparel  ESTIMATED BLOOD LOSS: 150 mL  UNIQUE ASPECTS OF THE CASE: His supraspinatus was still attached to the greater tuberosity, but was malunited, and the superior aspect of the humeral head was eroded.  The articular cartilage lower down was still intact, and the glenoid was still okay, but he was definitely having progressive superior wear as the greater tuberosity was abrading the undersurface of the acromion.  The morphology of the glenoid was relatively normal, from what I could tell, although I oriented the baseplate with a little bit of anterosuperior placement for the superior screw, such that it seemed like a coracoid base screw, and I was surprised at how long the screws were, the central screw was 45 mm, but I had an excellent feel, and excellent purchase.  I did put the humerus twice, because at the completion of the glenoid preparation I was unable to reduce the trial.  The subscapularis repair was reasonable.  The biceps tendon was not present within the groove, nor was it present on the glenoid.  PREOPERATIVE INDICATIONS:  [name] is a  49 y.o. male with a diagnosis of right shoulder greater tuberosity malunion with superior rotator cuff arthropathy who failed conservative measures and elected for surgical management.    The risks benefits and alternatives were discussed with the patient preoperatively including but not limited to the risks of infection, bleeding, nerve injury,  cardiopulmonary complications, the need for revision surgery, dislocation, brachial plexus palsy, incomplete relief of pain, among others, and the patient was willing to proceed.  OPERATIVE IMPLANTS: Biomet size 13 humeral stem press-fit micro with a 40 mm reverse shoulder arthroplasty tray with a +3 tapered offset liner and a 36 mm glenosphere with a mini baseplate and 4 locking screws and one central nonlocking screw.  OPERATIVE FINDINGS: Significant superior humeral head erosions, with greater tuberosity malunion.  He was slightly stiff preoperatively, with only 0 to 150 degrees during examination under anesthesia.  OPERATIVE PROCEDURE: The patient was brought to the operating room and placed in the supine position. General anesthesia was administered. IV antibiotics were given.  Time out was performed. The upper extremity was prepped and draped in usual sterile fashion. The patient was in a beachchair position. Deltopectoral approach was carried out. The biceps was not present.  The subscapularis was released off of the bone.   I then performed circumferential releases of the humerus, and then dislocated the head, and then reamed with the reamer to the above named size.  I then applied the jig, and cut the humeral head in 30 of retroversion, and then turned my attention to the glenoid.  Deep retractors were placed, and I resected the labrum, and then placed a guidepin into the center position on the glenoid, with slight inferior inclination. I then reamed over the guidepin, and this created a small metaphyseal cancellus blush inferiorly, removing just the cartilage to the subchondral bone superiorly. The base plate was selected and impacted place, and then I secured it centrally with a nonlocking screw, and I had  excellent purchase both inferiorly and superiorly. I placed a locking screws on anterior and posterior aspects, the anterior one was actually fairly long.  I then turned my attention to  the glenosphere, and impacted this into place, placing slight inferior offset (set on B).   The glenosphere was completely seated, and had engagement of the [facility] taper. I then turned my attention back to the humerus.  I sequentially broached, and then trialed, and was found to restore soft tissue tension, and it had 2 finger tightness. Initially it was too tight, so I re-cut the humerus.  Therefore the above named components were selected. The shoulder felt stable throughout functional motion.  Before I placed the real prosthesis I had also placed a total of 3 #2 FiberWire through drill holes in the humerus for later subscapularis repair.    I then impacted the real prosthesis into place, as well as the real humeral tray, and reduced the shoulder. The shoulder had excellent motion, and was stable, and I irrigated the wounds copiously.    I then used these to repair the subscapularis. Initially I placed three, but I was not satisfied with the security so I placed one more.  This came down to bone.  I then irrigated the shoulder copiously once more, repaired the deltopectoral interval with Vicryl followed by subcutaneous Vicryl with Steri-Strips and sterile gauze for the skin. The patient was awakened and returned back in stable and satisfactory condition. There were no complications and He tolerated the procedure well.

## 2020-04-27 NOTE — Anesthesia Procedure Notes (Signed)
Anesthesia Regional Block: Interscalene brachial plexus block   Pre-Anesthetic Checklist: ,, timeout performed, Correct Patient, Correct Site, Correct Laterality, Correct Procedure, Correct Position, site marked, Risks and benefits discussed,  Surgical consent,  Pre-op evaluation,  At surgeon's request and post-op pain management  Laterality: Right and Upper  Prep: chloraprep       Needles:  Injection technique: Single-shot  Needle Type: Echogenic Stimulator Needle     Needle Length: 9cm  Needle Gauge: 20   Needle insertion depth: 1 cm   Additional Needles:   Procedures:,,,, ultrasound used (permanent image in chart),,,,  Narrative:  Start time: 04/27/2020 1:10 PM End time: 04/27/2020 1:20 PM Injection made incrementally with aspirations every 5 mL.  Performed by: Personally  Anesthesiologist: Leilani Able, MD

## 2020-04-27 NOTE — Progress Notes (Signed)
Assisted Dr. Hatchett with right, ultrasound guided, interscalene  block. Side rails up, monitors on throughout procedure. See vital signs in flow sheet. Tolerated Procedure well.  

## 2020-04-27 NOTE — Discharge Instructions (Signed)

## 2020-04-28 LAB — BASIC METABOLIC PANEL
Anion gap: 9 (ref 5–15)
BUN: 13 mg/dL (ref 6–20)
CO2: 24 mmol/L (ref 22–32)
Calcium: 8.6 mg/dL — ABNORMAL LOW (ref 8.9–10.3)
Chloride: 104 mmol/L (ref 98–111)
Creatinine, Ser: 1.05 mg/dL (ref 0.61–1.24)
GFR calc Af Amer: >60 mL/min (ref >60)
GFR calc non Af Amer: >60 mL/min (ref >60)
Glucose, Bld: 135 mg/dL — ABNORMAL HIGH (ref 70–99)
Potassium: 4.9 mmol/L (ref 3.5–5.1)
Sodium: 137 mmol/L (ref 135–145)

## 2020-04-28 MED ORDER — ACETAMINOPHEN 500 MG PO TABS: 500.0000 mg | ORAL_TABLET | Freq: Four times a day (QID) | ORAL | Status: DC

## 2020-04-28 MED ORDER — SENNA-DOCUSATE SODIUM 8.6-50 MG PO TABS
2.0000 | ORAL_TABLET | Freq: Every day | ORAL | 1 refills | Status: DC
Start: 2020-04-28 — End: 2021-05-09

## 2020-04-28 MED ORDER — CEFAZOLIN SODIUM-DEXTROSE 2-4 GM/100ML-% IV SOLN
2.0000 g | Freq: Four times a day (QID) | INTRAVENOUS | Status: DC
  Administered 2020-04-28 (×2): 2 g via INTRAVENOUS
  Filled 2020-04-28 (×2): qty 100

## 2020-04-28 MED ORDER — ONDANSETRON HCL 4 MG PO TABS: 4.0000 mg | ORAL_TABLET | Freq: Four times a day (QID) | ORAL | Status: DC | PRN

## 2020-04-28 MED ORDER — ONDANSETRON HCL 4 MG/2ML IJ SOLN: 4.0000 mg | Freq: Four times a day (QID) | INTRAMUSCULAR | Status: DC | PRN

## 2020-04-28 MED ORDER — METOCLOPRAMIDE HCL 5 MG/ML IJ SOLN: 5.0000 mg | Freq: Three times a day (TID) | INTRAMUSCULAR | Status: DC | PRN

## 2020-04-28 MED ORDER — POTASSIUM CHLORIDE IN NACL 20-0.45 MEQ/L-% IV SOLN
INTRAVENOUS | Status: DC
  Filled 2020-04-28: qty 1000

## 2020-04-28 MED ORDER — ACETAMINOPHEN 325 MG PO TABS: 325.0000 mg | ORAL_TABLET | Freq: Four times a day (QID) | ORAL | Status: DC | PRN

## 2020-04-28 MED ORDER — MENTHOL 3 MG MT LOZG: 1.0000 | LOZENGE | OROMUCOSAL | Status: DC | PRN

## 2020-04-28 MED ORDER — DIPHENHYDRAMINE HCL 12.5 MG/5ML PO ELIX: 12.5000 mg | ORAL_SOLUTION | ORAL | Status: DC | PRN

## 2020-04-28 MED ORDER — METOCLOPRAMIDE HCL 5 MG PO TABS: 5.0000 mg | ORAL_TABLET | Freq: Three times a day (TID) | ORAL | Status: DC | PRN

## 2020-04-28 MED ORDER — ZOLPIDEM TARTRATE 5 MG PO TABS: 5.0000 mg | ORAL_TABLET | Freq: Every evening | ORAL | Status: DC | PRN

## 2020-04-28 MED ORDER — MAGNESIUM CITRATE PO SOLN: 1.0000 | Freq: Once | ORAL | Status: DC | PRN

## 2020-04-28 MED ORDER — HYDROCODONE-ACETAMINOPHEN 10-325 MG PO TABS: 1.0000 | ORAL_TABLET | ORAL | 0 refills | Status: DC | PRN

## 2020-04-28 MED ORDER — ALUM & MAG HYDROXIDE-SIMETH 200-200-20 MG/5ML PO SUSP: 30.0000 mL | ORAL | Status: DC | PRN

## 2020-04-28 MED ORDER — PHENOL 1.4 % MT LIQD: 1.0000 | OROMUCOSAL | Status: DC | PRN

## 2020-04-28 MED ORDER — DOCUSATE SODIUM 100 MG PO CAPS
100.0000 mg | ORAL_CAPSULE | Freq: Two times a day (BID) | ORAL | Status: DC
  Administered 2020-04-28: 100 mg via ORAL
  Filled 2020-04-28: qty 1

## 2020-04-28 MED ORDER — BISACODYL 10 MG RE SUPP: 10.0000 mg | Freq: Every day | RECTAL | Status: DC | PRN

## 2020-04-28 MED ORDER — POLYETHYLENE GLYCOL 3350 17 G PO PACK: 17.0000 g | PACK | Freq: Every day | ORAL | Status: DC | PRN

## 2020-04-28 NOTE — Progress Notes (Addendum)
OT has attempted to work with patient multiple times today prior to discharge. Patient disrespectful to staff, resistant to care and states "I already know about my shoulder I want to sleep". Situation escalated to Charge RN and DON. Patient re-educated on importance of OT prior to discharge and despite multiple attempts from staff members patient is still refusing. Will update MD.
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
## 2020-04-28 NOTE — Progress Notes (Signed)
OT Cancellation Note  Patient Details Name: John Howe MRN: [id number] DOB: 01-07-1971   Cancelled Treatment:    Reason Eval/Treat Not Completed: Patient declined, no reason specified  Have attempted to see patient multiple times this morning for OT.  Pt in bed with blanket over head.  Pt made multiple demands all of which were met, but continued to refuse to remove blanket from head or work with OT.  Pt educated on importance of OT session in order to go home with proper instructions on care of post-op RT arm and wearing of sling.  Pt responded, "Jesus Christ, I'm going to sleep." and ignored OT after that point.   Would recommend home health OT to address pt's post-op care as pt refusing acute OT services multiple times this morning.    Julien Girt 04/28/2020, 11:20 AM

## 2020-04-28 NOTE — Progress Notes (Signed)
OT Cancellation Note  Patient Details Name: John Howe MRN: [id number] DOB: 17-Apr-1971   Cancelled Treatment:    Reason Eval/Treat Not Completed: Patient declined, no reason specified  4th attempt to offer OT services to pt at 14:41 and pt continues to refuse.  Required multiple nurses in room including DON for pt to agree to remove blanket from his head. Pt fleetingly opened eyes, but then quickly closed them and pointed to ceiling stating, "Y'all don't know anything. Only He knows." Increased time spent by Nursing and OT educating pt on importance of post-op instructions for care of RUE, but pt did not respond.  Pt pulled out phone and only looked at phone.  Nursing agreed for OT to leave room at that point.   Appreciate Nursing Team's assistance today.   Theodoro Clock 04/28/2020, 3:03 PM

## 2020-04-28 NOTE — Progress Notes (Signed)
     Subjective: 1 Day Post-Op s/p Procedure(s): REVERSE SHOULDER ARTHROPLASTY  Patient states pain is severe at right shoulder because he hasn't had pain medication in a few hours. Denies paraesthesias, nausea, vomiting, chest pain, shortness of breath. No other complaints.   Objective:  PE: VITALS:   Vitals:   04/27/20 2322 04/28/20 0206 04/28/20 0308 04/28/20 0552  BP: 105/74 116/83  124/85  Pulse: (!) 56 (!) 50  (!) 51  Resp: 15 16 16 14   Temp: 97.7 F (36.5 C) 97.7 F (36.5 C) 97.8 F (36.6 C) 97.7 F (36.5 C)  TempSrc: Oral Oral  Oral  SpO2: 99% 100%  99%  Weight:      Height:        ABD soft Neurovascular intact Sensation intact distally Intact pulses distally Incision: significant drainage  Able to flex, extend, and abduct all fingers of right hand  LABS  Results for orders placed or performed during the hospital encounter of 04/27/20 (from the past 24 hour(s))  Urine rapid drug screen (hosp performed)     Status: None   Collection Time: 04/27/20 10:58 AM  Result Value Ref Range   Opiates NONE DETECTED NONE DETECTED   Cocaine NONE DETECTED NONE DETECTED   Benzodiazepines NONE DETECTED NONE DETECTED   Amphetamines NONE DETECTED NONE DETECTED   Tetrahydrocannabinol NONE DETECTED NONE DETECTED   Barbiturates NONE DETECTED NONE DETECTED  Basic metabolic panel     Status: Abnormal   Collection Time: 04/28/20  3:08 AM  Result Value Ref Range   Sodium 137 135 - 145 mmol/L   Potassium 4.9 3.5 - 5.1 mmol/L   Chloride 104 98 - 111 mmol/L   CO2 24 22 - 32 mmol/L   Glucose, Bld 135 (H) 70 - 99 mg/dL   BUN 13 6 - 20 mg/dL   Creatinine, Ser 06/28/20 0.61 - 1.24 mg/dL   Calcium 8.6 (L) 8.9 - 10.3 mg/dL   GFR calc non Af Amer >60 >60 mL/min   GFR calc Af Amer >60 >60 mL/min   Anion gap 9 5 - 15    DG Shoulder Right Port  Result Date: 04/27/2020 CLINICAL DATA:  Postoperative evaluation. EXAM: PORTABLE RIGHT SHOULDER COMPARISON:  September 10, 2019 FINDINGS: There is  no evidence of acute fracture or dislocation. A radiopaque shoulder replacement is seen without evidence of surrounding lucency to suggest the presence of hardware loosening or infection. Mild degenerative changes seen involving the right acromioclavicular joint. Soft tissues are unremarkable. IMPRESSION: Right shoulder replacement without evidence of hardware complication or fracture. Electronically Signed   By: September 12, 2019 M.D.   On: 04/27/2020 17:45    Assessment/Plan: Principal Problem:   Post-traumatic arthrosis of right shoulder  1 Day Post-Op s/p Procedure(s): REVERSE SHOULDER ARTHROPLASTY  Weightbearing: NWB RUE, continue sling Insicional and dressing care: Dressing changed today, will likely need new mepilex prior to discharge, reinforce as needed VTE prophylaxis: ambulation, SCD's Pain control: continue current regimen Follow - up plan: Follow up in 2 weeks Dispo: pending OT eval, I spoke with sister 06/27/2020 who states he does have help at home from her and other family members. She is able to pick up from hospital after 1 pm today.   Contact information:   Weekdays 8-5 05-29-2006, Janine Ores [place] A fter hours and holidays please check [email or web address] for group call information for [facility]  [phone] 04/28/2020, 7:59 AM

## 2020-04-28 NOTE — Plan of Care (Signed)
  Problem: Education: Goal: Knowledge of General Education information will improve Description: Including pain rating scale, medication(s)/side effects and non-pharmacologic comfort measures Outcome: Progressing   Problem: Pain Managment: Goal: General experience of comfort will improve Outcome: Progressing   

## 2020-04-30 ENCOUNTER — Encounter (HOSPITAL_COMMUNITY): Payer: Self-pay | Admitting: Orthopedic Surgery

## 2020-05-12 ENCOUNTER — Encounter (HOSPITAL_COMMUNITY): Payer: Self-pay

## 2020-05-12 ENCOUNTER — Other Ambulatory Visit: Payer: Self-pay

## 2020-05-12 ENCOUNTER — Emergency Department (HOSPITAL_COMMUNITY): Payer: Medicaid Other

## 2020-05-12 ENCOUNTER — Emergency Department (HOSPITAL_COMMUNITY)
Admission: EM | Admit: 2020-05-12 | Discharge: 2020-05-12 | Disposition: A | Discharge status: Home / Self Care | Payer: Medicaid Other | Attending: Emergency Medicine | Admitting: Emergency Medicine

## 2020-05-12 DIAGNOSIS — Z5321 Procedure and treatment not carried out due to patient leaving prior to being seen by health care provider: Secondary | ICD-10-CM | POA: Insufficient documentation

## 2020-05-12 DIAGNOSIS — M25511 Pain in right shoulder: Secondary | ICD-10-CM | POA: Diagnosis present

## 2020-05-12 NOTE — ED Triage Notes (Signed)
Patient arrived via [facility] with complaints of right shoulder pain after surgery two weeks ago. States there is burning pain in his arm/shoulder. Declines taking any otc for pain prior to arrival. Pulses present.

## 2020-05-12 NOTE — ED Notes (Signed)
Called pt x3 for recheck of vitals and no response  

## 2020-05-13 ENCOUNTER — Other Ambulatory Visit: Payer: Self-pay

## 2020-05-13 ENCOUNTER — Emergency Department (HOSPITAL_COMMUNITY)
Admission: EM | Admit: 2020-05-13 | Discharge: 2020-05-13 | Disposition: A | Discharge status: Home / Self Care | Payer: Medicaid Other | Attending: Emergency Medicine | Admitting: Emergency Medicine

## 2020-05-13 ENCOUNTER — Emergency Department (HOSPITAL_COMMUNITY): Payer: Medicaid Other

## 2020-05-13 ENCOUNTER — Encounter (HOSPITAL_COMMUNITY): Payer: Self-pay | Admitting: Emergency Medicine

## 2020-05-13 DIAGNOSIS — M25511 Pain in right shoulder: Secondary | ICD-10-CM | POA: Insufficient documentation

## 2020-05-13 DIAGNOSIS — F17200 Nicotine dependence, unspecified, uncomplicated: Secondary | ICD-10-CM | POA: Insufficient documentation

## 2020-05-13 DIAGNOSIS — G8918 Other acute postprocedural pain: Secondary | ICD-10-CM | POA: Insufficient documentation

## 2020-05-13 MED ORDER — OXYCODONE-ACETAMINOPHEN 5-325 MG PO TABS
ORAL_TABLET | ORAL | Status: AC
  Filled 2020-05-13: qty 1
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
## 2020-05-13 NOTE — ED Notes (Signed)
Percocet 5 mg/325 mg PO administered by previous nurse during downtime at 05/13/20 0208. See downtime forms scanned into medical record.

## 2020-05-13 NOTE — Discharge Instructions (Addendum)
Your shoulder x-ray does not show any new abnormalities. There is no evidence of infection. It will be important that you follow up with Dr. Dion Saucier for further management of pain in the shoulder.   You were provided prescriptions at the time of your surgery that should provide pain relief. Therapy was also recommended at the time of surgery that would likely help as well, however, you declined this treatment option.   Please call Dr. Shelba Flake office in the morning to schedule a time to be seen for further management.

## 2020-05-13 NOTE — ED Triage Notes (Signed)
Patient complaining of right shoulder pain. He states he has arthritis in it and is hurting.

## 2020-07-10 ENCOUNTER — Other Ambulatory Visit: Payer: Self-pay

## 2020-07-10 ENCOUNTER — Encounter (HOSPITAL_COMMUNITY): Payer: Self-pay | Admitting: Emergency Medicine

## 2020-07-10 ENCOUNTER — Emergency Department (HOSPITAL_COMMUNITY): Payer: Medicaid Other

## 2020-07-10 ENCOUNTER — Emergency Department (HOSPITAL_COMMUNITY)
Admission: EM | Admit: 2020-07-10 | Discharge: 2020-07-10 | Disposition: A | Discharge status: Home / Self Care | Payer: Medicaid Other | Attending: Emergency Medicine | Admitting: Emergency Medicine

## 2020-07-10 DIAGNOSIS — F17210 Nicotine dependence, cigarettes, uncomplicated: Secondary | ICD-10-CM | POA: Diagnosis not present

## 2020-07-10 DIAGNOSIS — M25562 Pain in left knee: Secondary | ICD-10-CM | POA: Diagnosis not present

## 2020-07-10 MED ORDER — BACLOFEN 10 MG PO TABS: 10.0000 mg | ORAL_TABLET | Freq: Three times a day (TID) | ORAL | 0 refills | Status: DC

## 2020-07-10 NOTE — ED Triage Notes (Signed)
Pt reports L knee pain that started this morning when he was walking his dog. States that he has had to get fluid drawn off of it in the past. States that it is difficult to bend.
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
## 2020-07-10 NOTE — Discharge Instructions (Addendum)
Take baclofen as prescribed.  Follow-up with your PCP if pain persists.

## 2020-10-18 ENCOUNTER — Encounter (HOSPITAL_COMMUNITY): Payer: Self-pay | Admitting: *Deleted

## 2020-10-18 ENCOUNTER — Emergency Department (HOSPITAL_COMMUNITY)
Admission: EM | Admit: 2020-10-18 | Discharge: 2020-10-19 | Disposition: A | Discharge status: Home / Self Care | Payer: Medicaid Other | Attending: Emergency Medicine | Admitting: Emergency Medicine

## 2020-10-18 DIAGNOSIS — F17200 Nicotine dependence, unspecified, uncomplicated: Secondary | ICD-10-CM | POA: Insufficient documentation

## 2020-10-18 DIAGNOSIS — Z96611 Presence of right artificial shoulder joint: Secondary | ICD-10-CM | POA: Diagnosis not present

## 2020-10-18 DIAGNOSIS — M25561 Pain in right knee: Secondary | ICD-10-CM

## 2020-10-18 NOTE — ED Triage Notes (Addendum)
Pt reports pain to right lower leg since yesterday. Pain is posterior behind his knee, states that it fills like fluid. Denies swelling and denies any hx of blood clots.

## 2020-10-19 ENCOUNTER — Emergency Department (HOSPITAL_BASED_OUTPATIENT_CLINIC_OR_DEPARTMENT_OTHER): Payer: Medicaid Other

## 2020-10-19 DIAGNOSIS — M7989 Other specified soft tissue disorders: Secondary | ICD-10-CM | POA: Diagnosis not present

## 2020-10-19 DIAGNOSIS — R2241 Localized swelling, mass and lump, right lower limb: Secondary | ICD-10-CM

## 2020-10-19 MED ORDER — ACETAMINOPHEN 500 MG PO TABS
1000.0000 mg | ORAL_TABLET | Freq: Once | ORAL | Status: AC
  Administered 2020-10-19: 1000 mg via ORAL
  Filled 2020-10-19: qty 2

## 2020-10-19 NOTE — Discharge Instructions (Signed)
It was our pleasure to provide your ER care today - we hope that you feel better.  Take acetaminophen or ibuprofen as need for pain.   Follow up with your doctor in the next few weeks if symptoms fail to improve/resolve.  Return to ER if worse, new symptoms, fevers, severe pain, spreading redness, severe swelling, or other concern.
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
## 2020-10-19 NOTE — Progress Notes (Signed)
Lower extremity venous RT study completed.  Preliminary results relayed to MD.   See CV Proc for preliminary results report.   Atsushi Yom, RDMS  

## 2021-03-10 ENCOUNTER — Encounter (HOSPITAL_COMMUNITY): Payer: Self-pay | Admitting: Emergency Medicine

## 2021-03-10 ENCOUNTER — Emergency Department (HOSPITAL_COMMUNITY)
Admission: EM | Admit: 2021-03-10 | Discharge: 2021-03-10 | Disposition: A | Discharge status: Home / Self Care | Payer: Medicaid Other | Attending: Emergency Medicine | Admitting: Emergency Medicine

## 2021-03-10 ENCOUNTER — Other Ambulatory Visit: Payer: Self-pay

## 2021-03-10 DIAGNOSIS — M7989 Other specified soft tissue disorders: Secondary | ICD-10-CM | POA: Diagnosis present

## 2021-03-10 DIAGNOSIS — F17210 Nicotine dependence, cigarettes, uncomplicated: Secondary | ICD-10-CM | POA: Insufficient documentation

## 2021-03-10 DIAGNOSIS — R609 Edema, unspecified: Secondary | ICD-10-CM | POA: Diagnosis not present

## 2021-03-10 DIAGNOSIS — R600 Localized edema: Secondary | ICD-10-CM

## 2021-03-10 LAB — COMPREHENSIVE METABOLIC PANEL
ALT: 16 U/L (ref 0–44)
AST: 13 U/L — ABNORMAL LOW (ref 15–41)
Albumin: 3.7 g/dL (ref 3.5–5.0)
Alkaline Phosphatase: 65 U/L (ref 38–126)
Anion gap: 4 — ABNORMAL LOW (ref 5–15)
BUN: 10 mg/dL (ref 6–20)
CO2: 31 mmol/L (ref 22–32)
Calcium: 9.1 mg/dL (ref 8.9–10.3)
Chloride: 104 mmol/L (ref 98–111)
Creatinine, Ser: 1.15 mg/dL (ref 0.61–1.24)
GFR, Estimated: >60 mL/min (ref >60)
Glucose, Bld: 82 mg/dL (ref 70–99)
Potassium: 3.6 mmol/L (ref 3.5–5.1)
Sodium: 139 mmol/L (ref 135–145)
Total Bilirubin: 0.7 mg/dL (ref 0.3–1.2)
Total Protein: 6.8 g/dL (ref 6.5–8.1)

## 2021-03-10 LAB — CBC WITH DIFFERENTIAL/PLATELET
Abs Immature Granulocytes: 0 10*3/uL (ref 0.00–0.07)
Basophils Absolute: 0 10*3/uL (ref 0.0–0.1)
Basophils Relative: 1 %
Eosinophils Absolute: 0.1 10*3/uL (ref 0.0–0.5)
Eosinophils Relative: 2 %
HCT: 42.4 % (ref 39.0–52.0)
Hemoglobin: 13.5 g/dL (ref 13.0–17.0)
Immature Granulocytes: 0 %
Lymphocytes Relative: 45 %
Lymphs Abs: 2 10*3/uL (ref 0.7–4.0)
MCH: 30.2 pg (ref 26.0–34.0)
MCHC: 31.8 g/dL (ref 30.0–36.0)
MCV: 94.9 fL (ref 80.0–100.0)
Monocytes Absolute: 0.4 10*3/uL (ref 0.1–1.0)
Monocytes Relative: 9 %
Neutro Abs: 1.9 10*3/uL (ref 1.7–7.7)
Neutrophils Relative %: 43 %
Platelets: 278 10*3/uL (ref 150–400)
RBC: 4.47 MIL/uL (ref 4.22–5.81)
RDW: 13.3 % (ref 11.5–15.5)
WBC: 4.5 10*3/uL (ref 4.0–10.5)
nRBC: 0 % (ref 0.0–0.2)

## 2021-03-10 LAB — BRAIN NATRIURETIC PEPTIDE: B Natriuretic Peptide: 38.5 pg/mL (ref 0.0–100.0)

## 2021-03-10 NOTE — Discharge Instructions (Addendum)
It is very important to elevate your legs whenever you are not moving.  May need to be above the level of your heart.  You should also get compression stockings to help with the fluid shift.  If you develop shortness of breath, chest pain, or any other new/concerning symptoms then return to the ER for evaluation.

## 2021-03-10 NOTE — ED Triage Notes (Signed)
Pt arrived via [facility] from home with c/o bilateral leg pain. Pitting edema noted in lower extremities. Pt reports he noticed swelling 3 days ago and today was sudden onset of severe pain.   148/100 80 HR RR-18 98% room air.
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
## 2021-03-23 ENCOUNTER — Other Ambulatory Visit: Payer: Self-pay

## 2021-03-23 ENCOUNTER — Other Ambulatory Visit (HOSPITAL_COMMUNITY): Payer: Self-pay | Admitting: Orthopedic Surgery

## 2021-03-23 ENCOUNTER — Ambulatory Visit (HOSPITAL_COMMUNITY)
Admission: RE | Admit: 2021-03-23 | Discharge: 2021-03-23 | Disposition: A | Discharge status: Home / Self Care | Payer: Medicaid Other | Source: Ambulatory Visit | Attending: Orthopedic Surgery | Admitting: Orthopedic Surgery

## 2021-03-23 DIAGNOSIS — M7989 Other specified soft tissue disorders: Secondary | ICD-10-CM

## 2021-03-23 DIAGNOSIS — M79661 Pain in right lower leg: Secondary | ICD-10-CM | POA: Diagnosis not present

## 2021-03-23 DIAGNOSIS — M79669 Pain in unspecified lower leg: Secondary | ICD-10-CM

## 2021-03-23 DIAGNOSIS — R609 Edema, unspecified: Secondary | ICD-10-CM | POA: Diagnosis not present

## 2021-03-23 DIAGNOSIS — M79662 Pain in left lower leg: Secondary | ICD-10-CM | POA: Insufficient documentation

## 2021-03-23 NOTE — Progress Notes (Signed)
Lower extremity venous has been completed.   Preliminary results in CV Proc.   Blanch Media 03/23/2021 3:53 PM

## 2021-04-25 ENCOUNTER — Other Ambulatory Visit: Payer: Self-pay

## 2021-04-25 DIAGNOSIS — M79669 Pain in unspecified lower leg: Secondary | ICD-10-CM

## 2021-04-25 DIAGNOSIS — M7989 Other specified soft tissue disorders: Secondary | ICD-10-CM

## 2021-05-09 ENCOUNTER — Other Ambulatory Visit: Payer: Self-pay

## 2021-05-09 ENCOUNTER — Encounter: Payer: Self-pay | Admitting: Physician Assistant

## 2021-05-09 ENCOUNTER — Ambulatory Visit (HOSPITAL_COMMUNITY)
Admission: RE | Admit: 2021-05-09 | Discharge: 2021-05-09 | Disposition: A | Discharge status: Home / Self Care | Payer: Medicaid Other | Source: Ambulatory Visit | Attending: Surgery | Admitting: Surgery

## 2021-05-09 ENCOUNTER — Ambulatory Visit (INDEPENDENT_AMBULATORY_CARE_PROVIDER_SITE_OTHER): Payer: Medicaid Other | Admitting: Physician Assistant

## 2021-05-09 VITALS — BP 128/84 | HR 55 | Ht 74.0 in | Wt 199.4 lb

## 2021-05-09 DIAGNOSIS — M79669 Pain in unspecified lower leg: Secondary | ICD-10-CM | POA: Insufficient documentation

## 2021-05-09 DIAGNOSIS — M7989 Other specified soft tissue disorders: Secondary | ICD-10-CM | POA: Insufficient documentation
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
## 2021-06-01 ENCOUNTER — Ambulatory Visit (INDEPENDENT_AMBULATORY_CARE_PROVIDER_SITE_OTHER): Payer: Medicaid Other | Admitting: Primary Care

## 2021-06-19 ENCOUNTER — Emergency Department (HOSPITAL_COMMUNITY): Payer: Medicaid Other

## 2021-06-19 ENCOUNTER — Emergency Department (HOSPITAL_COMMUNITY)
Admission: EM | Admit: 2021-06-19 | Discharge: 2021-06-19 | Disposition: A | Discharge status: Home / Self Care | Payer: Medicaid Other | Attending: Emergency Medicine | Admitting: Emergency Medicine

## 2021-06-19 ENCOUNTER — Encounter (HOSPITAL_COMMUNITY): Payer: Self-pay

## 2021-06-19 ENCOUNTER — Other Ambulatory Visit: Payer: Self-pay

## 2021-06-19 DIAGNOSIS — S20212A Contusion of left front wall of thorax, initial encounter: Secondary | ICD-10-CM

## 2021-06-19 DIAGNOSIS — F17210 Nicotine dependence, cigarettes, uncomplicated: Secondary | ICD-10-CM | POA: Diagnosis not present

## 2021-06-19 DIAGNOSIS — S299XXA Unspecified injury of thorax, initial encounter: Secondary | ICD-10-CM | POA: Diagnosis present

## 2021-06-19 DIAGNOSIS — Z96611 Presence of right artificial shoulder joint: Secondary | ICD-10-CM | POA: Diagnosis not present

## 2021-06-19 DIAGNOSIS — W07XXXA Fall from chair, initial encounter: Secondary | ICD-10-CM | POA: Insufficient documentation

## 2021-06-19 DIAGNOSIS — W19XXXA Unspecified fall, initial encounter: Secondary | ICD-10-CM

## 2021-06-19 DIAGNOSIS — S2232XA Fracture of one rib, left side, initial encounter for closed fracture: Secondary | ICD-10-CM | POA: Diagnosis not present

## 2021-06-19 DIAGNOSIS — M25511 Pain in right shoulder: Secondary | ICD-10-CM | POA: Diagnosis not present

## 2021-06-19 MED ORDER — HYDROMORPHONE HCL 2 MG/ML IJ SOLN
2.0000 mg | Freq: Once | INTRAMUSCULAR | Status: AC
  Administered 2021-06-19: 2 mg via INTRAMUSCULAR
  Filled 2021-06-19: qty 1

## 2021-06-19 MED ORDER — OXYCODONE-ACETAMINOPHEN 5-325 MG PO TABS
1.0000 | ORAL_TABLET | Freq: Once | ORAL | Status: AC
  Administered 2021-06-19: 1 via ORAL
  Filled 2021-06-19: qty 1

## 2021-06-19 MED ORDER — OXYCODONE-ACETAMINOPHEN 5-325 MG PO TABS: 1.0000 | ORAL_TABLET | Freq: Four times a day (QID) | ORAL | 0 refills | Status: AC | PRN

## 2021-06-19 MED ORDER — KETOROLAC TROMETHAMINE 60 MG/2ML IM SOLN
60.0000 mg | Freq: Once | INTRAMUSCULAR | Status: AC
  Administered 2021-06-19: 60 mg via INTRAMUSCULAR
  Filled 2021-06-19: qty 2

## 2021-06-19 NOTE — ED Provider Notes (Signed)
Emergency Medicine Provider Triage Evaluation Note  John Howe , a 50 y.o. male  was evaluated in triage.  Pt complains of rib pain and right shoulder pain.  He states that he fell yesterday morning and hit his chest on a table.  Prior to the fall he said that he felt like his legs "turned to jello".  Since then he says that his pain is worse with talking and breathing.  He has not taken anything for these symptoms.  Review of Systems  Positive: Rib pain, shortness of breath Negative: Weakness, numbness  Physical Exam  There were no vitals taken for this visit. Gen:   Awake, no distress   Resp:  Normal effort, lungs clear in all fields MSK:   Moves extremities without difficulty  Other:  Significant tenderness to palpation of left lateral chest   Medical Decision Making  Medically screening exam initiated at 8:27 PM.  Appropriate orders placed.  John Howe was informed that the remainder of the evaluation will be completed by another provider, this initial triage assessment does not replace that evaluation, and the importance of remaining in the ED until their evaluation is complete.     Jeanella Flattery 06/19/21 2028    Koleen Distance, MD 06/19/21 2214

## 2021-06-19 NOTE — ED Provider Notes (Signed)
  Physical Exam  BP (!) 131/96   Pulse 65   Temp 98.1 F (36.7 C)   Resp 16   Ht 6\' 3"  (1.905 m)   Wt 93 kg   SpO2 99%   BMI 25.62 kg/m   Physical Exam Vitals and nursing note reviewed.  Constitutional:      General: He is not in acute distress.    Appearance: Normal appearance. He is not ill-appearing.  HENT:     Head: Normocephalic and atraumatic.  Pulmonary:     Effort: Pulmonary effort is normal.  Skin:    General: Skin is warm and dry.  Neurological:     Mental Status: He is alert and oriented to person, place, and time.    ED Course/Procedures     Procedures  MDM   Care assumed from Dr. at shift change.  Patient awaiting results of a CT scan.  Patient apparently injured his chest yesterday and a fall.  He is complaining of pain to his left ribs.  Care signed out to me awaiting results of a CT scan of the chest which has returned showing a left 11th rib fracture that is nondisplaced.  There is no effusion, pneumothorax, or pulmonary contusion.  Patient appears comfortable and oxygen saturations are normal.  At this point, patient to be discharged with pain medication and follow-up as needed.  When I explained the results of the CT scan, patient was upset that he was being discharged, stating that "I guess the hospital at the hospital no more".  I explained to him that there was no indication for admission or surgery.  Patient only required pain control and will be prescribed medication for this.  He then began to argue with me about the difference between his rib being "broke", and not "fractured".  He became further annoyed when I informed him these terms were interchangeable.  He was also angry about the hospital not providing him with transportation home.  At this point, additional pain meds will be given and patient discharged to home.       Delford Field, MD 06/19/21 [phone]

## 2021-06-19 NOTE — ED Triage Notes (Signed)
Pt BIB EMS. Pt complains of pain from a fall yesterday. Left abdomen, visible bruising. Pt complains of right shoulder pain from a previous surgery.

## 2021-06-19 NOTE — Discharge Instructions (Signed)
Take ibuprofen 600 mg every 6 hours as needed for pain.  Begin taking Percocet as prescribed as needed for pain not relieved with ibuprofen.  Follow-up with primary doctor if not improving in the next 1 to 2 weeks, hand return to the ER if you develop difficulty breathing, worsening pain, or other new and concerning symptoms.

## 2021-06-19 NOTE — ED Notes (Addendum)
Patient upset at discharge that we are sending him home with a broken rib. MD and this RN explained to patient that broken ribs are treated with supportive care. Patient demanding a cab, stating, "Y'all are going to have to get me a cab or something." Discussed with patient that we do not have and no not provide the resources for transportation home. Informed patient I could get him a bus pass. Patient states, "Do you see what time it is? The busses aren't running." This RN offered for patient to stay in the lobby until he was able to find a ride. Patient upset by this stating, "just give me my shot and I guess I'll walk out of here." Informed patient that I could get him a wheelchair, to which he stated, "What makes you think I can walk?" Patient provided with a wheelchair. This RN discussed his percocet prescription, pain regimen, OTC and non-medication options. Patient states that the medicine and other interventions are not going to work. Discussed follow up options with patient, to which he stated he did not have a PCP, only an "arm" doctor. Informed patient to see his orthopedic doctor, to which he stated he was not going back to him. Educated patient using paperwork resources on ways to get a doctor through [place]. Patient verbalized understanding and taken to lobby. Per patient, he does have a ride, but his sister did not want to drive all the way over here to pick him up.
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
## 2021-08-31 ENCOUNTER — Encounter (HOSPITAL_COMMUNITY): Payer: Self-pay

## 2021-08-31 ENCOUNTER — Other Ambulatory Visit: Payer: Self-pay

## 2021-08-31 ENCOUNTER — Emergency Department (HOSPITAL_COMMUNITY)
Admission: EM | Admit: 2021-08-31 | Discharge: 2021-09-01 | Disposition: A | Discharge status: Home / Self Care | Payer: Medicaid Other | Attending: Emergency Medicine | Admitting: Emergency Medicine

## 2021-08-31 DIAGNOSIS — F17210 Nicotine dependence, cigarettes, uncomplicated: Secondary | ICD-10-CM | POA: Insufficient documentation

## 2021-08-31 DIAGNOSIS — H9202 Otalgia, left ear: Secondary | ICD-10-CM | POA: Diagnosis present

## 2021-08-31 DIAGNOSIS — H60502 Unspecified acute noninfective otitis externa, left ear: Secondary | ICD-10-CM | POA: Insufficient documentation

## 2021-08-31 NOTE — ED Triage Notes (Signed)
Pt BIB EMS with left ear pain x 2 days.

## 2021-09-01 MED ORDER — CIPROFLOXACIN-DEXAMETHASONE 0.3-0.1 % OT SUSP
4.0000 [drp] | Freq: Once | OTIC | Status: AC
  Administered 2021-09-01: 4 [drp] via OTIC
  Filled 2021-09-01: qty 7.5

## 2021-09-01 NOTE — ED Notes (Signed)
Reviewed d/c instructions and medication use with pt. Pt verbalized understanding. Pt ambulatory with a steady gait on d/c. Pt A&Ox4.
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
## 2021-09-01 NOTE — Discharge Instructions (Signed)
Continue drops--- 4 drops in left ear twice a day. Can follow-up with ENT if you have ongoing issues. Return here for new concerns.

## 2022-03-01 ENCOUNTER — Encounter (HOSPITAL_COMMUNITY): Payer: Self-pay

## 2022-03-01 ENCOUNTER — Emergency Department (HOSPITAL_COMMUNITY)
Admission: EM | Admit: 2022-03-01 | Discharge: 2022-03-01 | Disposition: A | Discharge status: Home / Self Care | Payer: Medicaid Other | Attending: Emergency Medicine | Admitting: Emergency Medicine

## 2022-03-01 ENCOUNTER — Emergency Department (HOSPITAL_COMMUNITY): Payer: Medicaid Other

## 2022-03-01 ENCOUNTER — Other Ambulatory Visit: Payer: Self-pay

## 2022-03-01 DIAGNOSIS — M10072 Idiopathic gout, left ankle and foot: Secondary | ICD-10-CM | POA: Diagnosis not present

## 2022-03-01 DIAGNOSIS — M79675 Pain in left toe(s): Secondary | ICD-10-CM | POA: Insufficient documentation

## 2022-03-01 DIAGNOSIS — M79674 Pain in right toe(s): Secondary | ICD-10-CM | POA: Insufficient documentation

## 2022-03-01 DIAGNOSIS — M109 Gout, unspecified: Secondary | ICD-10-CM

## 2022-03-01 DIAGNOSIS — M10071 Idiopathic gout, right ankle and foot: Secondary | ICD-10-CM | POA: Diagnosis not present

## 2022-03-01 MED ORDER — NAPROXEN 500 MG PO TABS: 500.0000 mg | ORAL_TABLET | Freq: Two times a day (BID) | ORAL | 0 refills | Status: AC

## 2022-03-01 MED ORDER — PREDNISONE 10 MG (21) PO TBPK: ORAL_TABLET | Freq: Every day | ORAL | 0 refills | Status: AC

## 2022-03-01 NOTE — ED Triage Notes (Signed)
Pt BIB EMS from home. Pt reports bilateral toe pain and swelling x2 days.

## 2022-03-01 NOTE — ED Provider Notes (Signed)
[facility] DEPT Provider Note   CSN: [id number] Arrival date & time: 03/01/22  1509     History  Chief Complaint  Patient presents with   Toe Pain    John Howe is a 51 y.o. male.  Patient presents with bilateral great toe pain, swelling, and redness.  Patient denies any recent injury or trauma.  Denies any known history of gout.  States that both joints became swollen at approximate the same time.  Denies recent alcohol usage, heavy meat diet.  Past medical history significant for history of alcohol abuse, seizures  HPI     Home Medications Prior to Admission medications   Medication Sig Start Date End Date Taking? Authorizing Provider  naproxen (NAPROSYN) 500 MG tablet Take 1 tablet (500 mg total) by mouth 2 (two) times daily for 7 days. 03/01/22 03/08/22 Yes Margan Elias, Casimer Leek, PA-C  predniSONE (STERAPRED UNI-PAK 21 TAB) 10 MG (21) TBPK tablet Take by mouth daily. Take 6 tabs by mouth daily  for 2 days, then 5 tabs for 2 days, then 4 tabs for 2 days, then 3 tabs for 2 days, 2 tabs for 2 days, then 1 tab by mouth daily for 2 days 03/01/22  Yes Amie Critchley, Casimer Leek, PA-C  oxyCODONE-acetaminophen (PERCOCET) 5-325 MG tablet Take 1-2 tablets by mouth every 6 (six) hours as needed. 06/19/21   Veryl Speak, MD      Allergies    Cheese, Chocolate, Penicillins, and Red wine complex [germanium]    Review of Systems   Review of Systems  Constitutional:  Negative for fever.  Respiratory:  Negative for shortness of breath.   Gastrointestinal:  Negative for nausea.  Musculoskeletal:  Positive for arthralgias.   Physical Exam Updated Vital Signs BP (!) 126/91 (BP Location: Right Arm)   Pulse 87   Temp 98.3 F (36.8 C) (Oral)   Resp 18   SpO2 97%  Physical Exam Vitals and nursing note reviewed.  Constitutional:      General: He is not in acute distress. HENT:     Head: Normocephalic.  Eyes:     Conjunctiva/sclera: Conjunctivae normal.   Cardiovascular:     Rate and Rhythm: Normal rate.  Pulmonary:     Effort: Pulmonary effort is normal.  Musculoskeletal:        General: Swelling and tenderness present.     Cervical back: Normal range of motion.     Comments: Mild swelling, tenderness, erythema noted at base of great toe bilaterally  Neurological:     Mental Status: He is alert.    ED Results / Procedures / Treatments   Labs (all labs ordered are listed, but only abnormal results are displayed) Labs Reviewed - No data to display  EKG None  Radiology DG Toe Great Left  Result Date: 03/01/2022 CLINICAL DATA:  Pain. EXAM: LEFT GREAT TOE COMPARISON:  None Available. FINDINGS: There is no acute fracture or dislocation. There is swelling of the first toe. There are no cortical erosions. There is no periosteal reaction. Soft tissue protuberance noted in the region of the first toe nail. No radiopaque foreign body. IMPRESSION: 1. No acute osseous abnormality. 2. Soft tissue protuberance in the region of the first toe nail. Please correlate clinically. Electronically Signed   By: Ronney Asters M.D.   On: 03/01/2022 16:24   DG Toe Great Right  Result Date: 03/01/2022 CLINICAL DATA:  Pain. Pt BIB EMS from home. Pt reports bilateral great toe pain and swelling x2  days without injury EXAM: RIGHT GREAT TOE COMPARISON:  None Available. FINDINGS: No cortical erosion or destruction. There is no evidence of fracture or dislocation. There is no evidence of severe arthropathy or other focal bone abnormality. Soft tissues are unremarkable. Likely bandage overlying the distal first digit. IMPRESSION: 1. No acute displaced fracture or dislocation. 2. No radiographic findings to suggest osteomyelitis. Electronically Signed   By: Iven Finn M.D.   On: 03/01/2022 16:26    Procedures Procedures    Medications Ordered in ED Medications - No data to display  ED Course/ Medical Decision Making/ A&P                           Medical  Decision Making Amount and/or Complexity of Data Reviewed Radiology: ordered.   The patient presents with bilateral great toe pain.  Differential includes but is not limited to fracture, dislocation, gout, arthritis, osteomyelitis, and others  I ordered and interpreted imaging including plain films of bilateral great toes.  No fracture or dislocation noted bilaterally.  I agree with the radiologist findings.  Imaging shows no sign of fracture, or dislocation.  Osteoarthritis of the great toe bilaterally occurring at the same time is unlikely.  No signs of osteomyelitis on imaging.  This seems most likely to be a gout flare.  I will prescribe a course of naproxen and prednisone for the patient.  He should follow-up with orthopedics.  I will provide contact information.  Return as needed        Final Clinical Impression(s) / ED Diagnoses Final diagnoses:  Acute gout involving toe of left foot, unspecified cause  Acute gout involving toe of right foot, unspecified cause  Toe pain, right  Toe pain, left    Rx / DC Orders ED Discharge Orders          Ordered    naproxen (NAPROSYN) 500 MG tablet  2 times daily        03/01/22 1639    predniSONE (STERAPRED UNI-PAK 21 TAB) 10 MG (21) TBPK tablet  Daily        03/01/22 1639              Ronny Bacon 03/01/22 1639    Milton Ferguson, MD 03/01/22 2244

## 2022-03-01 NOTE — Discharge Instructions (Addendum)
You were seen today for what is likely gout. I have prescribed medication to help. If you continue to have issues, recommend follow up with orthopedics.

## 2022-03-01 NOTE — ED Notes (Signed)
Pt verbalized understanding of discharge instructions. Pt given bus pass.

## 2022-07-22 ENCOUNTER — Other Ambulatory Visit: Payer: Self-pay

## 2022-07-22 ENCOUNTER — Emergency Department (HOSPITAL_COMMUNITY): Payer: Medicaid Other

## 2022-07-22 ENCOUNTER — Emergency Department (HOSPITAL_COMMUNITY)
Admission: EM | Admit: 2022-07-22 | Discharge: 2022-07-22 | Disposition: A | Discharge status: Home / Self Care | Payer: Medicaid Other | Attending: Emergency Medicine | Admitting: Emergency Medicine

## 2022-07-22 DIAGNOSIS — M542 Cervicalgia: Secondary | ICD-10-CM | POA: Insufficient documentation

## 2022-07-22 MED ORDER — IBUPROFEN 800 MG PO TABS
800.0000 mg | ORAL_TABLET | Freq: Once | ORAL | Status: AC
  Administered 2022-07-22: 800 mg via ORAL
  Filled 2022-07-22: qty 1

## 2022-07-22 NOTE — Discharge Instructions (Addendum)
Your x-ray was without fracture or dislocation.  There was some evidence of degenerative disc disease, otherwise known as arthritis.  Take ibuprofen as needed for pain and discomfort.

## 2022-07-22 NOTE — ED Notes (Addendum)
Called pt's emergency contact #s for a ride home, not available. John Howe (godparent) [phone] called and states he cannot pick up pt.

## 2022-07-22 NOTE — ED Triage Notes (Signed)
Pt bib ems c/o rt shoulder pain radiating to neck x 1 year. Was taking some pain medicine but "it's been long gone". Denies fall/ trauma, no back pain.   150/90 HR 68 SPO2 99%
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
## 2022-07-22 NOTE — ED Notes (Signed)
Talked to pt re: discharge, pt ignoring this RN and keeps his eyes closed and states "my right shoulder is hurting and you are not gonna fix it?". Pt education done re: chronic pain and per AVS- shoulder has arthritis and may take Ibuprofen for pain. Pt refuses to leave and continues to keep his eyes closed.

## 2022-07-22 NOTE — ED Notes (Signed)
Pt discharged, escorted by security to the lobby. Ambulatory, no acute distress. Pt has his phone and tablet.

## 2022-09-04 ENCOUNTER — Other Ambulatory Visit: Payer: Self-pay

## 2022-09-04 ENCOUNTER — Encounter (HOSPITAL_BASED_OUTPATIENT_CLINIC_OR_DEPARTMENT_OTHER): Payer: Self-pay | Admitting: Emergency Medicine

## 2022-09-04 DIAGNOSIS — Z1152 Encounter for screening for COVID-19: Secondary | ICD-10-CM | POA: Diagnosis not present

## 2022-09-04 DIAGNOSIS — J069 Acute upper respiratory infection, unspecified: Secondary | ICD-10-CM | POA: Insufficient documentation

## 2022-09-04 DIAGNOSIS — R059 Cough, unspecified: Secondary | ICD-10-CM | POA: Diagnosis present

## 2022-09-04 LAB — RESP PANEL BY RT-PCR (FLU A&B, COVID) ARPGX2
Influenza A by PCR: NEGATIVE
Influenza B by PCR: NEGATIVE
SARS Coronavirus 2 by RT PCR: NEGATIVE

## 2022-09-04 NOTE — ED Triage Notes (Signed)
Pt BIB [facility] for nasal drainage, cough and headache x 2 days, no relief with Dayquil

## 2022-09-05 ENCOUNTER — Emergency Department (HOSPITAL_BASED_OUTPATIENT_CLINIC_OR_DEPARTMENT_OTHER)
Admission: EM | Admit: 2022-09-05 | Discharge: 2022-09-05 | Disposition: A | Discharge status: Home / Self Care | Payer: Medicaid Other | Attending: Emergency Medicine | Admitting: Emergency Medicine

## 2022-09-05 DIAGNOSIS — J069 Acute upper respiratory infection, unspecified: Secondary | ICD-10-CM

## 2022-09-05 MED ORDER — ACETAMINOPHEN 500 MG PO TABS
1000.0000 mg | ORAL_TABLET | Freq: Once | ORAL | Status: AC
  Administered 2022-09-05: 1000 mg via ORAL
  Filled 2022-09-05: qty 2

## 2022-09-05 MED ORDER — IBUPROFEN 800 MG PO TABS
800.0000 mg | ORAL_TABLET | Freq: Once | ORAL | Status: AC
  Administered 2022-09-05: 800 mg via ORAL
  Filled 2022-09-05: qty 1

## 2022-09-05 NOTE — ED Provider Notes (Signed)
[facility] EMERGENCY DEPT Provider Note   CSN: [id number] Arrival date & time: 09/04/22  2102     History  Chief Complaint  Patient presents with   Cough    John Howe is a 51 y.o. male.  The history is provided by the patient.  Cough Severity:  Moderate Onset quality:  Gradual Duration:  1 week Timing:  Intermittent Progression:  Unchanged Chronicity:  New Relieved by:  Nothing Worsened by:  Nothing Ineffective treatments:  None tried Associated symptoms: headaches, rhinorrhea and sinus congestion   Associated symptoms: no chills, no fever, no weight loss and no wheezing        Home Medications Prior to Admission medications   Medication Sig Start Date End Date Taking? Authorizing Provider  oxyCODONE-acetaminophen (PERCOCET) 5-325 MG tablet Take 1-2 tablets by mouth every 6 (six) hours as needed. 06/19/21   Geoffery Lyons, MD  predniSONE (STERAPRED UNI-PAK 21 TAB) 10 MG (21) TBPK tablet Take by mouth daily. Take 6 tabs by mouth daily  for 2 days, then 5 tabs for 2 days, then 4 tabs for 2 days, then 3 tabs for 2 days, 2 tabs for 2 days, then 1 tab by mouth daily for 2 days 03/01/22   Barrie Dunker B, PA-C      Allergies    Cheese, Chocolate, Penicillins, and Red wine complex [germanium]    Review of Systems   Review of Systems  Constitutional:  Negative for chills, fever and weight loss.  HENT:  Positive for rhinorrhea.   Eyes:  Negative for redness.  Respiratory:  Positive for cough. Negative for wheezing.   Neurological:  Positive for headaches.  All other systems reviewed and are negative.   Physical Exam Updated Vital Signs BP (!) 166/90   Pulse (!) 104   Temp 97.7 F (36.5 C)   Resp 18   Ht 6\' 2"  (1.88 m)   Wt 83 kg   SpO2 100%   BMI 23.49 kg/m  Physical Exam Vitals and nursing note reviewed.  Constitutional:      General: He is not in acute distress.    Appearance: Normal appearance. He is well-developed. He is not  diaphoretic.  HENT:     Head: Normocephalic and atraumatic.     Nose: Congestion and rhinorrhea present.  Eyes:     Conjunctiva/sclera: Conjunctivae normal.     Pupils: Pupils are equal, round, and reactive to light.  Cardiovascular:     Rate and Rhythm: Normal rate and regular rhythm.     Pulses: Normal pulses.     Heart sounds: Normal heart sounds.  Pulmonary:     Effort: Pulmonary effort is normal.     Breath sounds: Normal breath sounds. No wheezing or rales.  Abdominal:     General: Bowel sounds are normal.     Palpations: Abdomen is soft.     Tenderness: There is no abdominal tenderness. There is no guarding or rebound.  Musculoskeletal:        General: Normal range of motion.     Cervical back: Normal range of motion and neck supple.  Skin:    General: Skin is warm and dry.     Capillary Refill: Capillary refill takes less than 2 seconds.  Neurological:     General: No focal deficit present.     Mental Status: He is alert and oriented to person, place, and time.     Deep Tendon Reflexes: Reflexes normal.  Psychiatric:  Mood and Affect: Mood normal.        Behavior: Behavior normal.     ED Results / Procedures / Treatments   Labs (all labs ordered are listed, but only abnormal results are displayed) Labs Reviewed  RESP PANEL BY RT-PCR (FLU A&B, COVID) ARPGX2    EKG None  Radiology No results found.  Procedures Procedures    Medications Ordered in ED Medications  acetaminophen (TYLENOL) tablet 1,000 mg (has no administration in time range)  ibuprofen (ADVIL) tablet 800 mg (has no administration in time range)    ED Course/ Medical Decision Making/ A&P                           Medical Decision Making URI symptoms x 1 week.  Took dayquil   Amount and/or Complexity of Data Reviewed External Data Reviewed: notes.    Details: Previous notes reviewed  Labs: ordered.    Details: Negative covid and flu  Risk OTC drugs. Prescription drug  management. Risk Details: Very well appearing. With normal exam and vitals. No signs of bacterial PNA.  Stable for discharge with close follow up.  Strict returns.     Final Clinical Impression(s) / ED Diagnoses Final diagnoses:  Upper respiratory tract infection, unspecified type   Return for intractable cough, coughing up blood, fevers > 100.4 unrelieved by medication, shortness of breath, intractable vomiting, chest pain, shortness of breath, weakness, numbness, changes in speech, facial asymmetry, abdominal pain, passing out, Inability to tolerate liquids or food, cough, altered mental status or any concerns. No signs of systemic illness or infection. The patient is nontoxic-appearing on exam and vital signs are within normal limits.  I have reviewed the triage vital signs and the nursing notes. Pertinent labs & imaging results that were available during my care of the patient were reviewed by me and considered in my medical decision making (see chart for details). After history, exam, and medical workup I feel the patient has been appropriately medically screened and is safe for discharge home. Pertinent diagnoses were discussed with the patient. Patient was given return precautions.    Rx / DC Orders ED Discharge Orders     None         Bobette Leyh, MD 09/05/22 6151

## 2022-09-05 NOTE — ED Notes (Signed)
Pt. Provided with cab voucher and sitting in waiting room waiting for cab. ETA 25 min.

## 2023-11-20 ENCOUNTER — Ambulatory Visit: Payer: Self-pay | Admitting: General Practice

## 2023-11-20 NOTE — Telephone Encounter (Signed)
 Noted, I cannot place orders or care for this patient until he is established with me, he may also try contacting his shoulder surgeon to see him about the pain.

## 2023-11-20 NOTE — Telephone Encounter (Signed)
 Left a detailed message with the information below at the patient's cell number.

## 2023-11-20 NOTE — Telephone Encounter (Signed)
 Copied from CRM [id number]. Topic: Clinical - Red Word Triage >> Nov 20, 2023  9:19 AM [facility] wrote: Red Word that prompted transfer to Nurse Triage: pt states he had surgery on his R shoulder about 6 mo ago,and R shoulder piece has "dropped down" into his back.  In addition, both legs feel like they are vibrating, and they hurt!! Pain in legs is 9 1/4 and shoulder pain is 8 1/2   Chief Complaint: BLE and Shoulder Pain Symptoms: Shoulder Pain and Leg Pain Frequency: Chronic Pertinent Negatives: Patient denies numbness, weakness, fever, or incontinence. Disposition: [] ED /[x] Urgent Care (no appt availability in office) / [x] Appointment(In office/virtual)/ []  [facility]/ [] Home Care/ [] Refused Recommended Disposition /[] [facility]/ []  Follow-up with PCP Additional Notes: RA is being triaged for BLE pain and shoulder pain. The patient reports the pain in both areas as severe.  The patient is not established with a [facility] Provider. Recommended the patient is seen at the nearest Urgent Care for treatment, patient verbalized agreement with recommendation, and scheduled a new patient visit with Dr. Casimiro Needle at [place]. Verbalized understanding and agreed with establishing of care. See documenation below.   Reason for Disposition  [1] SEVERE pain (e.g., excruciating, unable to do any normal activities) AND [2] not improved 2 hours after pain medicine  Answer Assessment - Initial Assessment Questions 1. ONSET: "When did the muscle aches or body pains start?"      A couple of months ago  2. LOCATION: "What part of your body is hurting?" (e.g., entire body, arms, legs)      Both legs, and right shoulder  3. SEVERITY: "How bad is the pain?" (Scale 1-10; or mild, moderate, severe)   - MILD (1-3): doesn't interfere with normal activities    - MODERATE (4-7): interferes with normal activities or awakens from sleep    - SEVERE (8-10):  excruciating pain, unable to do any  normal activities      10 in Shoulder, Legs are a 9  4. CAUSE: "What do you think is causing the pains?"     Unsure  5. FEVER: "Have you been having fever?"     No  6. OTHER SYMPTOMS: "Do you have any other symptoms?" (e.g., chest pain, weakness, rash, cold or flu symptoms, weight loss)     No  8. TRAVEL: "Have you traveled out of the country in the last month?" (e.g., travel history, exposures)     No  Protocols used: Muscle Aches and Body Pain-A-AH

## 2023-11-20 NOTE — Telephone Encounter (Signed)
 Copied from CRM [id number]. Topic: Clinical - Red Word Triage >> Nov 20, 2023  5:10 PM Eunice Blase wrote: Red Word that prompted transfer to Nurse Triage: Pt called stated has pain in both legs and right shoulder. Answer Assessment - Initial Assessment Questions 1. REASON FOR CALL or QUESTION: "What is your reason for calling today?" or "How can I best help you?" or "What question do you have that I can help answer?"     Patient had spoken to Nurse Triage earlier today and had been triaged and an appt made for new patient establishment for chronic pain in both knees and right shoulder. Patient calling in to get information about appt booked today. Provided patient with time and date of appt, and address of office. Patient states no further needs at this time.  Protocols used: Information Only Call - No Triage-A-AH

## 2023-12-28 ENCOUNTER — Emergency Department (HOSPITAL_COMMUNITY): Payer: MEDICAID

## 2023-12-28 ENCOUNTER — Other Ambulatory Visit: Payer: Self-pay

## 2023-12-28 ENCOUNTER — Emergency Department (HOSPITAL_COMMUNITY)
Admission: EM | Admit: 2023-12-28 | Discharge: 2023-12-28 | Disposition: A | Discharge status: Home / Self Care | Payer: MEDICAID | Attending: Emergency Medicine | Admitting: Emergency Medicine

## 2023-12-28 DIAGNOSIS — R000 Tachycardia, unspecified: Secondary | ICD-10-CM | POA: Diagnosis not present

## 2023-12-28 DIAGNOSIS — E876 Hypokalemia: Secondary | ICD-10-CM | POA: Diagnosis not present

## 2023-12-28 DIAGNOSIS — R569 Unspecified convulsions: Secondary | ICD-10-CM | POA: Insufficient documentation

## 2023-12-28 LAB — CBC WITH DIFFERENTIAL/PLATELET
Abs Immature Granulocytes: 0.02 10*3/uL (ref 0.00–0.07)
Basophils Absolute: 0 10*3/uL (ref 0.0–0.1)
Basophils Relative: 1 %
Eosinophils Absolute: 0.1 10*3/uL (ref 0.0–0.5)
Eosinophils Relative: 1 %
HCT: 48.4 % (ref 39.0–52.0)
Hemoglobin: 15.5 g/dL (ref 13.0–17.0)
Immature Granulocytes: 0 %
Lymphocytes Relative: 24 %
Lymphs Abs: 1.4 10*3/uL (ref 0.7–4.0)
MCH: 29.1 pg (ref 26.0–34.0)
MCHC: 32 g/dL (ref 30.0–36.0)
MCV: 90.8 fL (ref 80.0–100.0)
Monocytes Absolute: 0.4 10*3/uL (ref 0.1–1.0)
Monocytes Relative: 6 %
Neutro Abs: 4.1 10*3/uL (ref 1.7–7.7)
Neutrophils Relative %: 68 %
Platelets: 287 10*3/uL (ref 150–400)
RBC: 5.33 MIL/uL (ref 4.22–5.81)
RDW: 13.3 % (ref 11.5–15.5)
WBC: 5.9 10*3/uL (ref 4.0–10.5)
nRBC: 0 % (ref 0.0–0.2)

## 2023-12-28 LAB — MAGNESIUM: Magnesium: 2.5 mg/dL — ABNORMAL HIGH (ref 1.7–2.4)

## 2023-12-28 LAB — COMPREHENSIVE METABOLIC PANEL WITH GFR
ALT: 13 U/L (ref 0–44)
AST: 19 U/L (ref 15–41)
Albumin: 3.5 g/dL (ref 3.5–5.0)
Alkaline Phosphatase: 49 U/L (ref 38–126)
Anion gap: 9 (ref 5–15)
BUN: 8 mg/dL (ref 6–20)
CO2: 29 mmol/L (ref 22–32)
Calcium: 9.2 mg/dL (ref 8.9–10.3)
Chloride: 102 mmol/L (ref 98–111)
Creatinine, Ser: 1.37 mg/dL — ABNORMAL HIGH (ref 0.61–1.24)
GFR, Estimated: >60 mL/min (ref >60)
Glucose, Bld: 91 mg/dL (ref 70–99)
Potassium: 3.4 mmol/L — ABNORMAL LOW (ref 3.5–5.1)
Sodium: 140 mmol/L (ref 135–145)
Total Bilirubin: 1.1 mg/dL (ref 0.0–1.2)
Total Protein: 6.8 g/dL (ref 6.5–8.1)

## 2023-12-28 LAB — RAPID URINE DRUG SCREEN, HOSP PERFORMED
Amphetamines: NOT DETECTED
Barbiturates: NOT DETECTED
Benzodiazepines: NOT DETECTED
Cocaine: NOT DETECTED
Opiates: NOT DETECTED
Tetrahydrocannabinol: NOT DETECTED

## 2023-12-28 LAB — ETHANOL: Alcohol, Ethyl (B): <10 mg/dL (ref <10)

## 2023-12-28 MED ORDER — DIVALPROEX SODIUM 500 MG PO DR TAB: 500.0000 mg | DELAYED_RELEASE_TABLET | Freq: Every day | ORAL | 1 refills | Status: AC

## 2023-12-28 MED ORDER — DIVALPROEX SODIUM 250 MG PO DR TAB
500.0000 mg | DELAYED_RELEASE_TABLET | Freq: Once | ORAL | Status: AC
  Administered 2023-12-28: 500 mg via ORAL
  Filled 2023-12-28: qty 2

## 2023-12-28 MED ORDER — LACTATED RINGERS IV BOLUS
1000.0000 mL | Freq: Once | INTRAVENOUS | Status: AC
  Administered 2023-12-28: 1000 mL via INTRAVENOUS

## 2023-12-28 MED ORDER — POTASSIUM CHLORIDE CRYS ER 20 MEQ PO TBCR
20.0000 meq | EXTENDED_RELEASE_TABLET | Freq: Once | ORAL | Status: AC
  Administered 2023-12-28: 20 meq via ORAL
  Filled 2023-12-28: qty 1

## 2023-12-28 NOTE — ED Notes (Signed)
 Discharge paper work reviewed with pt. No questions at this time. Pt leaving with [employer] [facility] no new onset distress at this time.

## 2023-12-28 NOTE — ED Notes (Signed)
 CCMD called to add pt to monitor services.
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
## 2023-12-28 NOTE — ED Provider Notes (Signed)
 [place] EMERGENCY DEPARTMENT AT [facility] Provider Note   CSN: [id number] Arrival date & time: 12/28/23  2725     History  No chief complaint on file.   John Howe is a 53 y.o. male.  HPI 53 year old male presents with a seizure.  History is initially from EMS as well as the patient.  Patient has a history of a seizure disorder but has not had one in years and is not currently on meds.  EMS reports that family called them because they saw him shaking and tensing up like a seizure in his bed.  Patient seemed postictal to EMS and now is coming around.  The patient denies any acute complaints and states he has been feeling well recently and denies fever, headache, chest pain, or any other acute symptoms.  He drinks alcohol but states not every day. Last drank a couple days ago.  Home Medications Prior to Admission medications   Medication Sig Start Date End Date Taking? Authorizing Provider  oxyCODONE-acetaminophen (PERCOCET) 5-325 MG tablet Take 1-2 tablets by mouth every 6 (six) hours as needed. 06/19/21   Geoffery Lyons, MD  predniSONE (STERAPRED UNI-PAK 21 TAB) 10 MG (21) TBPK tablet Take by mouth daily. Take 6 tabs by mouth daily  for 2 days, then 5 tabs for 2 days, then 4 tabs for 2 days, then 3 tabs for 2 days, 2 tabs for 2 days, then 1 tab by mouth daily for 2 days 03/01/22   Barrie Dunker B, PA-C      Allergies    Cheese, Chocolate, Penicillins, and Red wine complex [germanium]    Review of Systems   Review of Systems  Constitutional:  Negative for fever.  Respiratory:  Negative for cough and shortness of breath.   Cardiovascular:  Negative for chest pain.  Neurological:  Positive for seizures. Negative for weakness, numbness and headaches.    Physical Exam Updated Vital Signs IschemiaBP 131/87   Pulse 99   Temp 98.8 F (37.1 C) (Oral)   Resp 14   Ht 6\' 2"  (1.88 m)   Wt 81.6 kg   SpO2 100%   BMI 23.11 kg/m  Physical Exam Vitals and nursing note  reviewed.  Constitutional:      General: He is not in acute distress.    Appearance: He is well-developed. He is not ill-appearing or diaphoretic.  HENT:     Head: Normocephalic and atraumatic.     Mouth/Throat:     Comments: No obvious oral/tongue injury. Eyes:     Pupils: Pupils are equal, round, and reactive to light.     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 Tachycardia present.     Heart sounds: Normal heart sounds.  Pulmonary:     Effort: Pulmonary effort is normal.     Breath sounds: Normal breath sounds.  Abdominal:     General: There is no distension.  Musculoskeletal:     Cervical back: No rigidity.  Skin:    General: Skin is warm and dry.  Neurological:     Mental Status: He is alert and oriented to person, place, and time.     Comments: CN 3-12 grossly intact save for chronic left eye abnormality. 5/5 strength in all 4 extremities. Grossly normal sensation. Normal finger to nose.      ED Results / Procedures / Treatments   Labs (all labs ordered are listed, but only abnormal  results are displayed) Labs Reviewed  COMPREHENSIVE METABOLIC PANEL WITH GFR - Abnormal; Notable for the following components:      Result Value   Potassium 3.4 (*)    Creatinine, Ser 1.37 (*)    All other components within normal limits  MAGNESIUM - Abnormal; Notable for the following components:   Magnesium 2.5 (*)    All other components within normal limits  CBC WITH DIFFERENTIAL/PLATELET  ETHANOL  RAPID URINE DRUG SCREEN, HOSP PERFORMED    EKG EKG Interpretation Date/Time:  Friday December 28 2023 08:14:37 EDT Ventricular Rate:  99 PR Interval:  163 QRS Duration:  85 QT Interval:  301 QTC Calculation: 387 R Axis:   72  Text Interpretation: Sinus rhythm Biatrial enlargement Left ventricular hypertrophy Anterior Q waves, possibly due to LVH Borderline T abnormalities, inferior leads no significant change since  earlier in the day Confirmed by Pricilla Loveless [phone]) on 12/28/2023 8:24:09 AM  Radiology CT Head Wo Contrast Result Date: 12/28/2023 CLINICAL DATA:  53 year old male with seizure like activity. EXAM: CT HEAD WITHOUT CONTRAST TECHNIQUE: Contiguous axial images were obtained from the base of the skull through the vertex without intravenous contrast. RADIATION DOSE REDUCTION: This exam was performed according to the departmental dose-optimization program which includes automated exposure control, adjustment of the mA and/or kV according to patient size and/or use of iterative reconstruction technique. COMPARISON:  Brain MRI 09/24/2004.  Head CT 03/27/2018. FINDINGS: Brain: Chronic encephalomalacia in the posterior left hemisphere which was present on the 2005 MRI. Temporal lobe, occipital lobe involvement on that side. Similar chronic contralateral right middle and superior frontal gyri encephalomalacia, pre motor. More subtle chronic encephalomalacia also in the left cerebellum PICA territory, stable. No superimposed No midline shift, ventriculomegaly, mass effect, evidence of mass lesion, intracranial hemorrhage or evidence of cortically based acute infarction. Vascular: No suspicious intracranial vascular hyperdensity. Skull: Partially healed left side go Matic fractures seen on the 2019 comparison. Calvarium appears intact. No acute osseous abnormality identified. Sinuses/Orbits: Visualized paranasal sinuses and mastoids are stable and well aerated. Other: Mildly Disconjugate gaze. No acute orbit or scalp soft tissue finding. Small benign left posterior convexity scalp lipoma series 4, image 51. IMPRESSION: 1. No acute intracranial abnormality. 2. Stable chronic encephalomalacia scattered in both cerebral hemispheres, the left cerebellum. 3. Chronic left zygomatic arch trauma. Electronically Signed   By: Odessa Fleming M.D.   On: 12/28/2023 09:43    Procedures Procedures    Medications Ordered in  ED Medications  potassium chloride SA (KLOR-CON M) CR tablet 20 mEq (has no administration in time range)  divalproex (DEPAKOTE) DR tablet 500 mg (has no administration in time range)  lactated ringers bolus 1,000 mL (1,000 mLs Intravenous New Bag/Given 12/28/23 0910)    ED Course/ Medical Decision Making/ A&P                                 Medical Decision Making Amount and/or Complexity of Data Reviewed External Data Reviewed: notes.    Details: Neurology notes Labs: ordered.    Details: Mild hypokalemia Radiology: ordered and independent interpretation performed.    Details: No head bleed, chronic findings ECG/medicine tests: ordered and independent interpretation performed.    Details: No ischemia  Risk Prescription drug management.   Patient present with what sounds like a seizure witnessed by family.  No seizure-like activity here in multiple hours.  He appears back to baseline.  He has no  focal neurodeficits.  Potassium was repleted.  Talking to patient he does drink alcohol on and off but it does not sound like this is alcohol withdrawal based on presentation.  Workup here is overall unremarkable.  I did discuss options with patient and he would like to go back on Depakote.  Discussed with the pharmacist in the emergency department that he can go back on his most recent dose which seems to have been 500 mg daily according to medication history in the chart.  Will refer back to neurology.  Low suspicion for infection.  Given return precautions.        Final Clinical Impression(s) / ED Diagnoses Final diagnoses:  Seizure [facility])    Rx / DC Orders ED Discharge Orders     None         Pricilla Loveless, MD 12/28/23 1122

## 2023-12-28 NOTE — Discharge Instructions (Addendum)
 If you develop recurrent seizures, headache, fever, or any other new/concerning symptoms then return to the ER.  - According to [place] law, you can not drive unless you are seizure / syncope free for at least 6 months and under physician's care.    - Please maintain precautions. Do not participate in activities where a loss of awareness could harm you or someone else. No swimming alone, no tub bathing, no hot tubs, no driving, no operating motorized vehicles (cars, ATVs, motocycles, etc), lawnmowers, power tools or firearms. No standing at heights, such as rooftops, ladders or stairs. Avoid hot objects such as stoves, heaters, open fires. Wear a helmet when riding a bicycle, scooter, skateboard, etc. and avoid areas of traffic. Set your water heater to 120 degrees or less.

## 2024-02-01 ENCOUNTER — Encounter (HOSPITAL_COMMUNITY): Payer: Self-pay

## 2024-02-08 ENCOUNTER — Ambulatory Visit: Payer: MEDICAID | Admitting: Family Medicine

## 2024-02-19 ENCOUNTER — Ambulatory Visit: Admitting: Diagnostic Neuroimaging

## 2024-02-20 ENCOUNTER — Telehealth: Payer: Self-pay | Admitting: Diagnostic Neuroimaging

## 2024-02-20 ENCOUNTER — Encounter: Payer: Self-pay | Admitting: Diagnostic Neuroimaging

## 2024-02-20 ENCOUNTER — Ambulatory Visit: Admitting: Diagnostic Neuroimaging

## 2024-02-20 NOTE — Telephone Encounter (Signed)
 Pt called in regards to appoint informed Pt appt was today . Pt states he will not be able to see any provider after June 1 the patient states he will be moving to [place]  June 1 . Pt would like to be seen before June Informed Pt next available date will be after June 1.

## 2024-02-20 NOTE — Telephone Encounter (Signed)
 Pt was scheduled for his appt today and called in and cancelled. This visit is considered a no show.  At this point, it would make more sense that the patient is established in [place]  where he is moving to. He will need to set up with a primary care who can also get him established with a neurologist in [place].  Alternatively he can still be scheduled to be seen here at next available appt, but would have to be in office visit and he would have to have someone bring him to the appointment here.   Called to advise the patient of this and there was no answer. Asked the patient to call back.

## 2024-02-26 ENCOUNTER — Ambulatory Visit: Payer: MEDICAID | Admitting: Family Medicine

## 2024-03-08 ENCOUNTER — Emergency Department (HOSPITAL_COMMUNITY): Payer: MEDICAID

## 2024-03-08 ENCOUNTER — Emergency Department (HOSPITAL_COMMUNITY)
Admission: EM | Admit: 2024-03-08 | Discharge: 2024-03-08 | Disposition: A | Discharge status: Home / Self Care | Payer: MEDICAID | Attending: Emergency Medicine | Admitting: Emergency Medicine

## 2024-03-08 ENCOUNTER — Other Ambulatory Visit: Payer: Self-pay

## 2024-03-08 ENCOUNTER — Encounter (HOSPITAL_COMMUNITY): Payer: Self-pay

## 2024-03-08 DIAGNOSIS — R0981 Nasal congestion: Secondary | ICD-10-CM

## 2024-03-08 DIAGNOSIS — J069 Acute upper respiratory infection, unspecified: Secondary | ICD-10-CM | POA: Diagnosis not present

## 2024-03-08 LAB — RESP PANEL BY RT-PCR (RSV, FLU A&B, COVID)  RVPGX2
Influenza A by PCR: NEGATIVE
Influenza B by PCR: NEGATIVE
Resp Syncytial Virus by PCR: NEGATIVE
SARS Coronavirus 2 by RT PCR: NEGATIVE

## 2024-03-08 MED ORDER — LORATADINE 10 MG PO TABS
10.0000 mg | ORAL_TABLET | Freq: Once | ORAL | Status: AC
  Administered 2024-03-08: 10 mg via ORAL
  Filled 2024-03-08: qty 1

## 2024-03-08 MED ORDER — LORATADINE 10 MG PO TABS
10.0000 mg | ORAL_TABLET | Freq: Every day | ORAL | 0 refills | Status: AC
Start: 2024-03-08 — End: ?

## 2024-03-08 MED ORDER — ACETAMINOPHEN ER 650 MG PO TBCR: 650.0000 mg | EXTENDED_RELEASE_TABLET | Freq: Three times a day (TID) | ORAL | 0 refills | Status: AC | PRN

## 2024-03-08 MED ORDER — ACETAMINOPHEN 325 MG PO TABS
650.0000 mg | ORAL_TABLET | Freq: Once | ORAL | Status: AC
  Administered 2024-03-08: 650 mg via ORAL
  Filled 2024-03-08: qty 2

## 2024-03-08 NOTE — ED Notes (Signed)
Pt given bus pass ?

## 2024-03-08 NOTE — ED Provider Notes (Signed)
 [place] EMERGENCY DEPARTMENT AT [facility] Provider Note   CSN: [id number] Arrival date & time: 03/08/24  1622     Patient presents with: Nasal Congestion   John Howe is a 53 y.o. male.   HPI     SUBJECTIVE:  John Howe is a 53 y.o. male who complains of congestion, sore throat, dry cough, fever, and chills for 2 days. He denies a history of nausea and vomiting and denies a history of asthma.  Patient does not smoke.    Prior to Admission medications   Medication Sig Start Date End Date Taking? Authorizing Provider  divalproex  (DEPAKOTE ) 500 MG DR tablet Take 1 tablet (500 mg total) by mouth daily. 12/28/23   Jerilynn Montenegro, MD  oxyCODONE -acetaminophen  (PERCOCET) 5-325 MG tablet Take 1-2 tablets by mouth every 6 (six) hours as needed. 06/19/21   Orvilla Blander, MD  predniSONE  (STERAPRED UNI-PAK 21 TAB) 10 MG (21) TBPK tablet Take by mouth daily. Take 6 tabs by mouth daily  for 2 days, then 5 tabs for 2 days, then 4 tabs for 2 days, then 3 tabs for 2 days, 2 tabs for 2 days, then 1 tab by mouth daily for 2 days 03/01/22   Elisa Guest, PA-C    Allergies: Cheese, Chocolate, Penicillins, and Red wine complex [germanium]    Review of Systems  Updated Vital Signs BP 134/79 (BP Location: Right Arm)   Pulse 94   Temp 99.4 F (37.4 C) (Oral)   Resp 20   Ht 6' 2 (1.88 m)   Wt 83.9 kg   SpO2 99%   BMI 23.75 kg/m   Physical Exam  (all labs ordered are listed, but only abnormal results are displayed) Labs Reviewed  RESP PANEL BY RT-PCR (RSV, FLU A&B, COVID)  [id number]    EKG: None  Radiology: [facility] Chest Port 1 View Result Date: 03/08/2024 EXAM: 1 VIEW XRAY OF THE CHEST 03/08/2024 06:09:00 PM COMPARISON: CT chest dated 06/19/2021. CLINICAL HISTORY: Cough, fever. Per chart: Pt to ER via EMS, per EMS pt is here for some bilateral leg swelling and pain, states that he has had the swelling everyday. Pt states that he is here for a runny nose. FINDINGS:  LUNGS AND PLEURA: No focal pulmonary opacity. No pulmonary edema. No pleural effusion. No pneumothorax. Stable right perihilar prominence, likely vascular when correlating with prior exams. HEART AND MEDIASTINUM: No acute abnormality of the cardiac and mediastinal silhouettes. BONES AND SOFT TISSUES: No acute osseous abnormality. IMPRESSION: 1. No acute cardiopulmonary abnormality. Electronically signed by: Zadie Herter MD 03/08/2024 07:08 PM EDT RP Workstation: [id number]     Procedures   Medications Ordered in the ED  loratadine (CLARITIN) tablet 10 mg (has no administration in time range)  acetaminophen  (TYLENOL ) tablet 650 mg (has no administration in time range)                                    Medical Decision Making Amount and/or Complexity of Data Reviewed Radiology: ordered.  Risk OTC drugs.   Differential diagnosis considered for this patient includes: Viral syndrome Influenza Pharyngitis Sinusitis Mononucleosis Electrolyte abnormality   OBJECTIVE: He appears well, nontoxic, vital signs are as noted. Ears normal.  Throat and pharynx normal.  Neck supple. No adenopathy in the neck. Nose is congested. Sinuses non tender. The chest is clear, without wheezes or rales.  ASSESSMENT:  viral upper respiratory illness  PLAN: Symptomatic therapy suggested: push fluids, rest, gargle warm salt water , use acetaminophen , antihistamine-decongestant of choice prn, and return office visit prn if symptoms persist or worsen. Lack of antibiotic effectiveness discussed with him. Call or return to clinic prn if these symptoms worsen or fail to improve as anticipated.  Final diagnoses:  Nasal congestion  Viral URI with cough    ED Discharge Orders     None          Deatra Face, MD 03/08/24 1936

## 2024-03-08 NOTE — Discharge Instructions (Addendum)
 We saw you in the ER for sore throat, congestion. We think what you have is a viral syndrome - the treatment for which is symptomatic relief only, and your body will fight the infection off in a few days. We are prescribing you some meds for pain and fevers. See your primary care doctor in 1 week if the symptoms dont improve.

## 2024-03-08 NOTE — ED Triage Notes (Signed)
 Pt to er via ems, per ems pt is here for some bilateral leg swelling and pain, states that he has had the swelling everyday.  Pt states that he is here for a runny nose,   Ems states that they gave 650 of tylenol  for a fever in route

## 2024-05-31 ENCOUNTER — Emergency Department (HOSPITAL_COMMUNITY)
Admission: EM | Admit: 2024-05-31 | Discharge: 2024-05-31 | Disposition: A | Discharge status: Home / Self Care | Payer: MEDICAID | Attending: Emergency Medicine | Admitting: Emergency Medicine

## 2024-05-31 ENCOUNTER — Encounter (HOSPITAL_COMMUNITY): Payer: Self-pay

## 2024-05-31 ENCOUNTER — Other Ambulatory Visit: Payer: Self-pay

## 2024-05-31 ENCOUNTER — Emergency Department (HOSPITAL_COMMUNITY): Payer: MEDICAID

## 2024-05-31 DIAGNOSIS — N289 Disorder of kidney and ureter, unspecified: Secondary | ICD-10-CM | POA: Insufficient documentation

## 2024-05-31 DIAGNOSIS — R569 Unspecified convulsions: Secondary | ICD-10-CM | POA: Insufficient documentation

## 2024-05-31 LAB — CBC WITH DIFFERENTIAL/PLATELET
Abs Immature Granulocytes: 0.01 K/uL (ref 0.00–0.07)
Basophils Absolute: 0 K/uL (ref 0.0–0.1)
Basophils Relative: 1 %
Eosinophils Absolute: 0.1 K/uL (ref 0.0–0.5)
Eosinophils Relative: 2 %
HCT: 47.6 % (ref 39.0–52.0)
Hemoglobin: 14.9 g/dL (ref 13.0–17.0)
Immature Granulocytes: 0 %
Lymphocytes Relative: 59 %
Lymphs Abs: 3.7 K/uL (ref 0.7–4.0)
MCH: 29.3 pg (ref 26.0–34.0)
MCHC: 31.3 g/dL (ref 30.0–36.0)
MCV: 93.5 fL (ref 80.0–100.0)
Monocytes Absolute: 0.4 K/uL (ref 0.1–1.0)
Monocytes Relative: 6 %
Neutro Abs: 2.1 K/uL (ref 1.7–7.7)
Neutrophils Relative %: 32 %
Platelets: 311 K/uL (ref 150–400)
RBC: 5.09 MIL/uL (ref 4.22–5.81)
RDW: 12.6 % (ref 11.5–15.5)
WBC: 6.3 K/uL (ref 4.0–10.5)
nRBC: 0 % (ref 0.0–0.2)

## 2024-05-31 LAB — COMPREHENSIVE METABOLIC PANEL WITH GFR
ALT: 10 U/L (ref 0–44)
AST: 17 U/L (ref 15–41)
Albumin: 4.2 g/dL (ref 3.5–5.0)
Alkaline Phosphatase: 72 U/L (ref 38–126)
Anion gap: 25 — ABNORMAL HIGH (ref 5–15)
BUN: 13 mg/dL (ref 6–20)
CO2: 16 mmol/L — ABNORMAL LOW (ref 22–32)
Calcium: 9.7 mg/dL (ref 8.9–10.3)
Chloride: 97 mmol/L — ABNORMAL LOW (ref 98–111)
Creatinine, Ser: 1.38 mg/dL — ABNORMAL HIGH (ref 0.61–1.24)
GFR, Estimated: >60 mL/min (ref >60)
Glucose, Bld: 141 mg/dL — ABNORMAL HIGH (ref 70–99)
Potassium: 3.4 mmol/L — ABNORMAL LOW (ref 3.5–5.1)
Sodium: 138 mmol/L (ref 135–145)
Total Bilirubin: 0.7 mg/dL (ref 0.0–1.2)
Total Protein: 7.1 g/dL (ref 6.5–8.1)

## 2024-05-31 LAB — I-STAT CHEM 8, ED
BUN: 16 mg/dL (ref 6–20)
Calcium, Ion: 1.17 mmol/L (ref 1.15–1.40)
Chloride: 101 mmol/L (ref 98–111)
Creatinine, Ser: 1.3 mg/dL — ABNORMAL HIGH (ref 0.61–1.24)
Glucose, Bld: 98 mg/dL (ref 70–99)
HCT: 42 % (ref 39.0–52.0)
Hemoglobin: 14.3 g/dL (ref 13.0–17.0)
Potassium: 3.7 mmol/L (ref 3.5–5.1)
Sodium: 140 mmol/L (ref 135–145)
TCO2: 29 mmol/L (ref 22–32)

## 2024-05-31 LAB — CBG MONITORING, ED: Glucose-Capillary: 90 mg/dL (ref 70–99)

## 2024-05-31 LAB — ETHANOL: Alcohol, Ethyl (B): <15 mg/dL (ref <15)

## 2024-05-31 LAB — VALPROIC ACID LEVEL: Valproic Acid Lvl: <10 ug/mL — ABNORMAL LOW (ref 50–100)

## 2024-05-31 MED ORDER — SODIUM CHLORIDE 0.9 % IV BOLUS
1000.0000 mL | Freq: Once | INTRAVENOUS | Status: AC
  Administered 2024-05-31: 1000 mL via INTRAVENOUS

## 2024-05-31 MED ORDER — LEVETIRACETAM 500 MG PO TABS: 500.0000 mg | ORAL_TABLET | Freq: Two times a day (BID) | ORAL | 0 refills | Status: AC

## 2024-05-31 MED ORDER — LEVETIRACETAM (KEPPRA) 500 MG/5 ML ADULT IV PUSH
2000.0000 mg | Freq: Once | INTRAVENOUS | Status: AC
  Administered 2024-05-31: 2000 mg via INTRAVENOUS
  Filled 2024-05-31: qty 20
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
## 2024-05-31 NOTE — Discharge Instructions (Addendum)
 Please take newly prescribed Keppra  for seizures.  Follow-up with [facility] neurology.

## 2024-05-31 NOTE — ED Triage Notes (Signed)
 Pt BIB [facility] from home for seizure that lasted 12 minutes recorded by family. Last seizure was 2 weeks ago per pt. Pt not currently on anti-seizure medications and does not see a neurologist. Family reported pt fell and hit head. Denies thinners. No c/o pain. A&O x 4, GCS 15. Family reports seizures are usually brought on by ETOH, pt denies.
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
## 2024-06-24 ENCOUNTER — Ambulatory Visit: Admitting: Diagnostic Neuroimaging

## 2024-07-30 ENCOUNTER — Ambulatory Visit: Admitting: Diagnostic Neuroimaging

## 2024-07-30 ENCOUNTER — Encounter: Payer: Self-pay | Admitting: Diagnostic Neuroimaging

## 2024-09-01 ENCOUNTER — Emergency Department (HOSPITAL_COMMUNITY)
Admission: EM | Admit: 2024-09-01 | Discharge: 2024-09-01 | Disposition: A | Discharge status: Home / Self Care | Attending: Emergency Medicine | Admitting: Emergency Medicine

## 2024-09-01 ENCOUNTER — Other Ambulatory Visit: Payer: Self-pay

## 2024-09-01 ENCOUNTER — Emergency Department (HOSPITAL_COMMUNITY)

## 2024-09-01 DIAGNOSIS — N3944 Nocturnal enuresis: Secondary | ICD-10-CM

## 2024-09-01 DIAGNOSIS — M25511 Pain in right shoulder: Secondary | ICD-10-CM

## 2024-09-01 LAB — CBC
HCT: 42.2 % (ref 39.0–52.0)
Hemoglobin: 13.2 g/dL (ref 13.0–17.0)
MCH: 29.4 pg (ref 26.0–34.0)
MCHC: 31.3 g/dL (ref 30.0–36.0)
MCV: 94 fL (ref 80.0–100.0)
Platelets: 287 K/uL (ref 150–400)
RBC: 4.49 MIL/uL (ref 4.22–5.81)
RDW: 13.2 % (ref 11.5–15.5)
WBC: 5 K/uL (ref 4.0–10.5)
nRBC: 0 % (ref 0.0–0.2)

## 2024-09-01 LAB — URINALYSIS, ROUTINE W REFLEX MICROSCOPIC
Bilirubin Urine: NEGATIVE
Glucose, UA: NEGATIVE mg/dL
Hgb urine dipstick: NEGATIVE
Ketones, ur: NEGATIVE mg/dL
Leukocytes,Ua: NEGATIVE
Nitrite: NEGATIVE
Protein, ur: NEGATIVE mg/dL
Specific Gravity, Urine: 1.014 (ref 1.005–1.030)
pH: 6 (ref 5.0–8.0)

## 2024-09-01 LAB — BASIC METABOLIC PANEL WITH GFR
Anion gap: 6 (ref 5–15)
BUN: 9 mg/dL (ref 6–20)
CO2: 31 mmol/L (ref 22–32)
Calcium: 8.8 mg/dL — ABNORMAL LOW (ref 8.9–10.3)
Chloride: 106 mmol/L (ref 98–111)
Creatinine, Ser: 0.98 mg/dL (ref 0.61–1.24)
GFR, Estimated: >60 mL/min (ref >60)
Glucose, Bld: 92 mg/dL (ref 70–99)
Potassium: 3.8 mmol/L (ref 3.5–5.1)
Sodium: 143 mmol/L (ref 135–145)

## 2024-09-01 LAB — ETHANOL: Alcohol, Ethyl (B): <15 mg/dL (ref <15)

## 2024-09-01 NOTE — ED Triage Notes (Addendum)
 PT arrives via EMS from home with complaints of nocturia/urinary incontinence while sleeping that has been worsening over the past 3 months. Pt states that during the day he has no issues. EMS report that the pt smells strongly of urine.

## 2024-09-01 NOTE — Discharge Instructions (Addendum)
 Your workup today showed no evidence of a bladder infection or other medical abnormality.  The x-ray of your shoulder as well as my physical exam showed no evidence of any shoulder dislocation, fracture, or other abnormality.  You can follow-up with the urologist if you continue to have abnormal urination at night for further evaluation.

## 2024-09-01 NOTE — ED Notes (Signed)
 Patient given urine sample cup with sticker and bag
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
